# Patient Record
Sex: Male | Born: 1990 | Race: White | Hispanic: No | Marital: Single | Smoking: Never smoker
Health system: Southern US, Community
[De-identification: ages and names within clinical notes are randomized; demographics above are authoritative.]

## PROBLEM LIST (undated history)

## (undated) DIAGNOSIS — K625 Hemorrhage of anus and rectum: Secondary | ICD-10-CM

## (undated) HISTORY — PX: COLONOSCOPY: SHX174

## (undated) HISTORY — PX: OTHER SURGICAL HISTORY: SHX169

## (undated) HISTORY — PX: UPPER GASTROINTESTINAL ENDOSCOPY: SHX188

## (undated) HISTORY — PX: WISDOM TOOTH EXTRACTION: SHX21

## (undated) HISTORY — DX: Hemorrhage of anus and rectum: K62.5

---

## 2017-10-29 ENCOUNTER — Ambulatory Visit: Payer: Self-pay | Admitting: Adult Health

## 2017-10-29 ENCOUNTER — Encounter: Payer: Self-pay | Admitting: Adult Health

## 2017-10-29 VITALS — BP 130/70 | HR 82 | Temp 98.0°F | Resp 16 | Ht 70.0 in | Wt 188.0 lb

## 2017-10-29 DIAGNOSIS — R748 Abnormal levels of other serum enzymes: Secondary | ICD-10-CM

## 2017-10-29 DIAGNOSIS — J0100 Acute maxillary sinusitis, unspecified: Secondary | ICD-10-CM

## 2017-10-29 DIAGNOSIS — Z87898 Personal history of other specified conditions: Secondary | ICD-10-CM

## 2017-10-29 MED ORDER — AMOXICILLIN-POT CLAVULANATE 875-125 MG PO TABS: 1.0000 | ORAL_TABLET | Freq: Two times a day (BID) | ORAL | 0 refills | Status: DC

## 2017-10-29 NOTE — Progress Notes (Signed)
Subjective:     Patient ID: Frank Monroe, male   DOB: 1990/10/21, 27 y.o.   MRN: [id number]  HPI   Patient is a 27 year old male in no acute distress who presents to the clinic with sinus symptoms congestion x one month. He reports the nasal congestion worsened on this past Sunday.   He reports sore throat started this morning. Decreased energy/ fatigue since Monday this week he reports.  He has continued to go to the gym and work out. He is working today.   He has used Neti pot in past with boiled water but not in past few weeks.   He denies any history of previous sinus infections.    He has history of nose bleeds since child and now only has occasional now that stops easily- he reports he last Ear Nose Throat MD since he was a child.  He reports he blew his nose today and had mild blood in with nasal discharge- green to clear blood tinges mucous.   He denies seeing MD in " years" and no recent lab work.   He denies any other bleeding. Denies any syncope or near syncope. Denies headaches  There are no active problems to display for this patient. Denies any medications- or over the counter or prescription medication use.  Patient  denies any fever, chills, rash, chest pain, shortness of breath, nausea, vomiting, or diarrhea.   Review of Systems  Constitutional: Positive for chills (Monday felt very cold ) and fatigue. Negative for activity change, appetite change (" still hungry" ), diaphoresis, fever and unexpected weight change.       Still working / still working out   HENT: Positive for congestion, nosebleeds, postnasal drip, rhinorrhea and sinus pressure. Negative for ear discharge and ear pain.   Eyes: Negative.   Respiratory: Positive for cough. Negative for apnea, choking, chest tightness, shortness of breath, wheezing and stridor.   Cardiovascular: Negative for chest pain, palpitations and leg swelling.  Gastrointestinal: Negative.   Genitourinary: Negative.    Musculoskeletal: Negative.   Skin: Negative.   Neurological: Negative.   Hematological: Negative.   Psychiatric/Behavioral: Negative.        Objective:   Physical Exam  Constitutional: He is oriented to person, place, and time. He appears well-developed and well-nourished. No distress.  HENT:  Head: Normocephalic and atraumatic.  Right Ear: Hearing, external ear and ear canal normal. No tenderness. Tympanic membrane is erythematous (mild erythema ) and retracted. Tympanic membrane is not perforated. A middle ear effusion is present.  Left Ear: Hearing, external ear and ear canal normal. No tenderness. Tympanic membrane is retracted. Tympanic membrane is not perforated and not erythematous. A middle ear effusion is present.  Nose: Mucosal edema and rhinorrhea present. No nose lacerations or sinus tenderness. Epistaxis (none currently. ) is observed. Right sinus exhibits maxillary sinus tenderness. Right sinus exhibits no frontal sinus tenderness. Left sinus exhibits maxillary sinus tenderness. Left sinus exhibits no frontal sinus tenderness.  Mouth/Throat: Uvula is midline and mucous membranes are normal. Posterior oropharyngeal erythema present. No oropharyngeal exudate, posterior oropharyngeal edema or tonsillar abscesses.  Eyes: Conjunctivae, EOM and lids are normal. Pupils are equal, round, and reactive to light. Right eye exhibits no discharge. Left eye exhibits no discharge. No scleral icterus.  Neck: Normal range of motion and full passive range of motion without pain. Neck supple. No JVD present. No tracheal deviation present.  Cardiovascular: Normal rate, regular rhythm, normal heart sounds and intact distal pulses. Exam  reveals no gallop and no friction rub.  No murmur heard. Pulmonary/Chest: Effort normal and breath sounds normal. No stridor. No respiratory distress. He has no wheezes. He has no rales. He exhibits no tenderness.  Abdominal: Soft. Bowel sounds are normal.   Musculoskeletal: Normal range of motion.  Patient moves on and off of exam table and in room without difficulty. Gait is normal in hall and in room. Patient is oriented to person place time and situation. Patient answers questions appropriately and engages in conversation.   Lymphadenopathy:       Head (right side): No submental, no submandibular, no tonsillar, no preauricular, no posterior auricular and no occipital adenopathy present.       Head (left side): No submental, no submandibular, no tonsillar, no preauricular, no posterior auricular and no occipital adenopathy present.    He has no cervical adenopathy.    He has no axillary adenopathy.  Neurological: He is alert and oriented to person, place, and time. He has normal strength. He displays normal reflexes. No cranial nerve deficit. He exhibits normal muscle tone. Coordination normal.  Skin: Skin is warm, dry and intact. No bruising and no rash noted. He is not diaphoretic. No cyanosis or erythema. No pallor. Nails show no clubbing.  Psychiatric: He has a normal mood and affect. His speech is normal and behavior is normal. Judgment and thought content normal. Cognition and memory are normal.  Vitals reviewed.      Assessment:     Acute non-recurrent maxillary sinusitis - Plan: CBC w/Diff, Comprehensive metabolic panel, Comprehensive metabolic panel  History of epistaxis     Plan:     Meds ordered this encounter  Medications  . amoxicillin-clavulanate (AUGMENTIN) 875-125 MG tablet    Sig: Take 1 tablet by mouth 2 (two) times daily.    Dispense:  20 tablet    Refill:  0   Also start over the counter antihistamine of choice such as Allegra, Claritin or Zyrtec daily. Do not buy the decongestant brand. Generic is fine.   Will check CBC and CMP for baseline patient given fatigue and nosebleeds denies seeing provider in " years".   As courtesy patient also was given handout with contact number/ web site  for primary care  physicians within [facility]  health system and also [place] website to find a primary care doctor.  [facility] physician referral line number is [phone] and web site is [email or web address]. patient may choose any primary care physician within any organization as he prefers.  Primary care is recommended.  For each patient coming to this office as we are no longer accepting new primary care patients and this is an acute care clinic at this time for new patients.  Recommend yearly physicals and follow-up as recommended by primary care.  Patient is advised that if he continues to have nosebleeds after treatment for sinusitis and adding over the counter second generation antihistamine to regimen  and trying over-the-counter bought sterile  nasal saline rinse, he will need to return to the office for a follow-up and referral to ear nose and throat physician for further expert  evaluation given history. Discussed possible differential diagnosis. He will also run a humidifier at home. He will call for an appointment and seek emergency care after hours as needed.   Education hand out on Sinusitis, Nosebleeds, Nasal Saline, Provider does not recommend using Nettie pot or water from the tap as question with sterility.  Advised to return to clinic for  an appointment if no improvement within 72 hours or if any symptoms change or worsen. Advised ER or urgent Care if after hours or on weekend. 911 for emergency symptoms at any time.      Patient verbalized understanding of all instructions given and denies any further questions at this time.

## 2017-10-29 NOTE — Patient Instructions (Signed)
Amoxicillin; Clavulanic Acid tablets What is this medicine? AMOXICILLIN; CLAVULANIC ACID (a mox i SIL in; KLAV yoo lan ic AS id) is a penicillin antibiotic. It is used to treat certain kinds of bacterial infections. It will not work for colds, flu, or other viral infections. This medicine may be used for other purposes; ask your health care provider or pharmacist if you have questions. COMMON BRAND NAME(S): Augmentin What should I tell my health care provider before I take this medicine? They need to know if you have any of these conditions: -bowel disease, like colitis -kidney disease -liver disease -mononucleosis -an unusual or allergic reaction to amoxicillin, penicillin, cephalosporin, other antibiotics, clavulanic acid, other medicines, foods, dyes, or preservatives -pregnant or trying to get pregnant -breast-feeding How should I use this medicine? Take this medicine by mouth with a full glass of water. Follow the directions on the prescription label. Take at the start of a meal. Do not crush or chew. If the tablet has a score line, you may cut it in half at the score line for easier swallowing. Take your medicine at regular intervals. Do not take your medicine more often than directed. Take all of your medicine as directed even if you think you are better. Do not skip doses or stop your medicine early. Talk to your pediatrician regarding the use of this medicine in children. Special care may be needed. Overdosage: If you think you have taken too much of this medicine contact a poison control center or emergency room at once. NOTE: This medicine is only for you. Do not share this medicine with others. What if I miss a dose? If you miss a dose, take it as soon as you can. If it is almost time for your next dose, take only that dose. Do not take double or extra doses. What may interact with this medicine? -allopurinol -anticoagulants -birth control pills -methotrexate -probenecid This  list may not describe all possible interactions. Give your health care provider a list of all the medicines, herbs, non-prescription drugs, or dietary supplements you use. Also tell them if you smoke, drink alcohol, or use illegal drugs. Some items may interact with your medicine. What should I watch for while using this medicine? Tell your doctor or health care professional if your symptoms do not improve. Do not treat diarrhea with over the counter products. Contact your doctor if you have diarrhea that lasts more than 2 days or if it is severe and watery. If you have diabetes, you may get a false-positive result for sugar in your urine. Check with your doctor or health care professional. Birth control pills may not work properly while you are taking this medicine. Talk to your doctor about using an extra method of birth control. What side effects may I notice from receiving this medicine? Side effects that you should report to your doctor or health care professional as soon as possible: -allergic reactions like skin rash, itching or hives, swelling of the face, lips, or tongue -breathing problems -dark urine -fever or chills, sore throat -redness, blistering, peeling or loosening of the skin, including inside the mouth -seizures -trouble passing urine or change in the amount of urine -unusual bleeding, bruising -unusually weak or tired -white patches or sores in the mouth or throat Side effects that usually do not require medical attention (report to your doctor or health care professional if they continue or are bothersome): -diarrhea -dizziness -headache -nausea, vomiting -stomach upset -vaginal or anal irritation This list may   not describe all possible side effects. Call your doctor for medical advice about side effects. You may report side effects to FDA at 1-800-FDA-1088. Where should I keep my medicine? Keep out of the reach of children. Store at room temperature below 25 degrees  C (77 degrees F). Keep container tightly closed. Throw away any unused medicine after the expiration date. NOTE: This sheet is a summary. It may not cover all possible information. If you have questions about this medicine, talk to your doctor, pharmacist, or health care provider.  2018 Elsevier/Gold Standard (2007-12-17 12:04:30) Nosebleed, Adult A nosebleed is when blood comes out of the nose. Nosebleeds are common. Usually, they are not a sign of a serious condition. Nosebleeds can happen if a small blood vessel in your nose starts to bleed or if the lining of your nose (mucous membrane) cracks. They are commonly caused by:  Allergies.  Colds.  Picking your nose.  Blowing your nose too hard.  An injury from sticking an object into your nose or getting hit in the nose.  Dry or cold air.  Less common causes of nosebleeds include:  Toxic fumes.  Something abnormal in the nose or in the air-filled spaces in the bones of the face (sinuses).  Growths in the nose, such as polyps.  Medicines or conditions that cause blood to clot slowly.  Certain illnesses or procedures that irritate or dry out the nasal passages.  Follow these instructions at home: When you have a nosebleed:  Sit down and tilt your head slightly forward.  Use a clean towel or tissue to pinch your nostrils under the bony part of your nose. After 10 minutes, let go of your nose and see if bleeding starts again. Do not release pressure before that time. If there is still bleeding, repeat the pinching and holding for 10 minutes until the bleeding stops.  Do not place tissues or gauze in the nose to stop bleeding.  Avoid lying down and avoid tilting your head backward. That may make blood collect in the throat and cause gagging or coughing.  Use a nasal spray decongestant to help with a nosebleed as told by your health care provider.  Do not use petroleum jelly or mineral oil in your nose. It can drip into your  lungs. After a nosebleed:  Avoid blowing your nose or sniffing for a number of hours.  Avoid straining, lifting, or bending at the waist for several days. You may resume other normal activities as you are able.  Use saline spray or a humidifier as told by your health care provider.  Aspirinand blood thinners make bleeding more likely. If you are prescribed these medicines and you suffer from nosebleeds: ? Ask your health care provider if you should stop taking the medicines or if you should adjust the dose. ? Do not stop taking medicines that your health care provider has recommended unless told by your health care provider.  If your nosebleed was caused by dry mucous membranes, use over-the-counter saline nasal spray or gel. This will keep the mucous membranes moist and allow them to heal. If you must use a lubricant: ? Choose one that is water-soluble. ? Use only as much as you need and use it only as often as needed. ? Do not lie down until several hours after you use it. Contact a health care provider if:  You have a fever.  You get nosebleeds often or more often than usual.  You bruise very easily.  You have a nosebleed from having something stuck in your nose.  You have bleeding in your mouth.  You vomit or cough up brown material.  You have a nosebleed after you start a new medicine. Get help right away if:  You have a nosebleed after a fall or a head injury.  Your nosebleed does not go away after 20 minutes.  You feel dizzy or weak.  You have unusual bleeding from other parts of your body.  You have unusual bruising on other parts of your body.  You become sweaty.  You vomit blood. This information is not intended to replace advice given to you by your health care provider. Make sure you discuss any questions you have with your health care provider. Document Released: 07/03/2005 Document Revised: 05/23/2016 Document Reviewed: 04/09/2016 Elsevier Interactive  Patient Education  2018 [employer] Sinusitis, Adult Sinusitis is soreness and inflammation of your sinuses. Sinuses are hollow spaces in the bones around your face. They are located:  Around your eyes.  In the middle of your forehead.  Behind your nose.  In your cheekbones.  Your sinuses and nasal passages are lined with a stringy fluid (mucus). Mucus normally drains out of your sinuses. When your nasal tissues get inflamed or swollen, the mucus can get trapped or blocked so air cannot flow through your sinuses. This lets bacteria, viruses, and funguses grow, and that leads to infection. Follow these instructions at home: Medicines  Take, use, or apply over-the-counter and prescription medicines only as told by your doctor. These may include nasal sprays.  If you were prescribed an antibiotic medicine, take it as told by your doctor. Do not stop taking the antibiotic even if you start to feel better. Hydrate and Humidify  Drink enough water to keep your pee (urine) clear or pale yellow.  Use a cool mist humidifier to keep the humidity level in your home above 50%.  Breathe in steam for 10-15 minutes, 3-4 times a day or as told by your doctor. You can do this in the bathroom while a hot shower is running.  Try not to spend time in cool or dry air. Rest  Rest as much as possible.  Sleep with your head raised (elevated).  Make sure to get enough sleep each night. General instructions  Put a warm, moist washcloth on your face 3-4 times a day or as told by your doctor. This will help with discomfort.  Wash your hands often with soap and water. If there is no soap and water, use hand sanitizer.  Do not smoke. Avoid being around people who are smoking (secondhand smoke).  Keep all follow-up visits as told by your doctor. This is important. Contact a doctor if:  You have a fever.  Your symptoms get worse.  Your symptoms do not get better within 10 days. Get help right  away if:  You have a very bad headache.  You cannot stop throwing up (vomiting).  You have pain or swelling around your face or eyes.  You have trouble seeing.  You feel confused.  Your neck is stiff.  You have trouble breathing. This information is not intended to replace advice given to you by your health care provider. Make sure you discuss any questions you have with your health care provider. Document Released: 03/11/2008 Document Revised: 05/19/2016 Document Reviewed: 07/19/2015 Elsevier Interactive Patient Education  2018 Elsevier Inc. Sinus Rinse What is a sinus rinse? A sinus rinse is a home treatment. It rinses  your sinuses with a mixture of salt and water (saline solution). Sinuses are air-filled spaces in your skull behind the bones of your face and forehead. They open into your nasal cavity. To do a sinus rinse, you will need:  Saline solution.  Neti pot or spray bottle. This releases the saline solution into your nose and through your sinuses. You can buy neti pots and spray bottles at: ? [employer]. ? A health food store. ? Online.  When should I do a sinus rinse? A sinus rinse can help to clear your nasal cavity. It can clear:  Mucus.  Dirt.  Dust.  Pollen.  You may do a sinus rinse when you have:  A cold.  A virus.  Allergies.  A sinus infection.  A stuffy nose.  If you are considering a sinus rinse:  Ask your child's doctor before doing a sinus rinse on your child.  Do not do a sinus rinse if you have had: ? Ear or nasal surgery. ? An ear infection. ? Blocked ears.  How do I do a sinus rinse?  Wash your hands.  Disinfect your device using the directions that came with the device.  Dry your device.  Use the solution that comes with your device or one that is sold separately in stores. Follow the mixing directions on the package.  Fill your device with the amount of saline solution as stated in the device  instructions.  Stand over a sink and tilt your head sideways over the sink.  Place the spout of the device in your upper nostril (the one closer to the ceiling).  Gently pour or squeeze the saline solution into the nasal cavity. The liquid should drain to the lower nostril if you are not too congested.  Gently blow your nose. Blowing too hard may cause ear pain.  Repeat in the other nostril.  Clean and rinse your device with clean water.  Air-dry your device. Are there risks of a sinus rinse? Sinus rinse is normally very safe and helpful. However, there are a few risks, which include:  A burning feeling in the sinuses. This may happen if you do not make the saline solution as instructed. Make sure to follow all directions when making the saline solution.  Infection from unclean water. This is rare, but possible.  Nasal irritation.  This information is not intended to replace advice given to you by your health care provider. Make sure you discuss any questions you have with your health care provider. Document Released: 04/20/2014 Document Revised: 08/20/2016 Document Reviewed: 02/08/2014 Elsevier Interactive Patient Education  2017 [employer].

## 2017-10-30 LAB — COMPREHENSIVE METABOLIC PANEL
ALBUMIN: 4.7 g/dL (ref 3.5–5.5)
ALK PHOS: 97 IU/L (ref 39–117)
ALT: 58 IU/L — AB (ref 0–44)
AST: 45 IU/L — AB (ref 0–40)
Albumin/Globulin Ratio: 2.2 (ref 1.2–2.2)
BILIRUBIN TOTAL: 0.4 mg/dL (ref 0.0–1.2)
BUN / CREAT RATIO: 15 (ref 9–20)
BUN: 15 mg/dL (ref 6–20)
CHLORIDE: 100 mmol/L (ref 96–106)
CO2: 26 mmol/L (ref 20–29)
Calcium: 10.1 mg/dL (ref 8.7–10.2)
Creatinine, Ser: 1 mg/dL (ref 0.76–1.27)
GFR calc Af Amer: 120 mL/min/{1.73_m2} (ref >59)
GFR calc non Af Amer: 103 mL/min/{1.73_m2} (ref >59)
GLUCOSE: 109 mg/dL — AB (ref 65–99)
Globulin, Total: 2.1 g/dL (ref 1.5–4.5)
Potassium: 4.4 mmol/L (ref 3.5–5.2)
Sodium: 141 mmol/L (ref 134–144)
Total Protein: 6.8 g/dL (ref 6.0–8.5)

## 2017-10-30 LAB — CBC WITH DIFFERENTIAL/PLATELET
BASOS ABS: 0 10*3/uL (ref 0.0–0.2)
Basos: 1 %
EOS (ABSOLUTE): 0.3 10*3/uL (ref 0.0–0.4)
Eos: 4 %
Hematocrit: 43.9 % (ref 37.5–51.0)
Hemoglobin: 15 g/dL (ref 13.0–17.7)
Immature Grans (Abs): 0 10*3/uL (ref 0.0–0.1)
Immature Granulocytes: 0 %
LYMPHS ABS: 2 10*3/uL (ref 0.7–3.1)
Lymphs: 31 %
MCH: 31 pg (ref 26.6–33.0)
MCHC: 34.2 g/dL (ref 31.5–35.7)
MCV: 91 fL (ref 79–97)
Monocytes Absolute: 0.9 10*3/uL (ref 0.1–0.9)
Monocytes: 15 %
NEUTROS ABS: 3.2 10*3/uL (ref 1.4–7.0)
Neutrophils: 49 %
PLATELETS: 261 10*3/uL (ref 150–379)
RBC: 4.84 x10E6/uL (ref 4.14–5.80)
RDW: 13.8 % (ref 12.3–15.4)
WBC: 6.3 10*3/uL (ref 3.4–10.8)

## 2017-10-30 NOTE — Addendum Note (Signed)
Addended by: Berniece PapFLINCHUM, Revere Maahs S on: 10/30/2017 11:47 AM   Modules accepted: Orders

## 2017-10-30 NOTE — Progress Notes (Signed)
Patient ID: Frank Monroe, male   DOB: 30-Mar-1991, 27 y.o.   MRN: [id number]   Armando Gangracy Greene RN called [facility] to add on TSH- order in computer as well.   I have already ordered this as a future order in the computer for lipid profile.   Orders Placed This Encounter  Procedures  .  .  .  . TSH  . Lipid Profile    Standing Status:   Future    Standing Expiration Date:   01/28/2018  TSH is add on order for now.  [facility] was notified

## 2017-10-30 NOTE — Progress Notes (Addendum)
Armando Gangracy Greene RN - 10/30/17 10:27am -  Please call patient and make phone call - let him know that he is not anemic, kidney function is normal, and  his liver enzymes are elevated mildly. Question if he has been using any alcohol or tylenol containing products. He should avoid all alcohol and tylenol containing products. Maintain low fat diet avoiding processed foods.  I am also adding on blood work for his thyroid and we will call if that is abnormal. He should have his liver panel checked again in 6 weeks and schedule an appointment for follow up the following week after labs. Keep current plan as discussed in office on 10/30/16 as well.  Provider thoroughly discussed in collaboration above plan with supervising physician Dr. Julieanne Mansonichard Gilbert who is in agreement with the care plan as above.

## 2017-10-31 ENCOUNTER — Telehealth: Payer: Self-pay

## 2017-10-31 NOTE — Telephone Encounter (Signed)
Contacted patient to inform him of lab results. (See Frank DusterMichelle Flinchum's note). Pt states he drinks occasionally approx 7 beers a week and takes Tylenol sporadically for fever.  Instructed patient to avoid alcohol and tylenol. He is to follow up in 6 weeks for repeat liver panel. Appointment already scheduled with us for 6 weeks. Patient verbalizes understanding of instructions and will contact us with any further questions.

## 2017-11-18 ENCOUNTER — Ambulatory Visit: Payer: Self-pay | Admitting: Dietician

## 2017-11-18 NOTE — Progress Notes (Signed)
Nutrition:  11/18/2017  [id number]:Seeking nutritional information for establishing a healthy way of eating to maintain normal blood enzymes.  Assessment: Recently diagnosed with elevated liver enzymes and advised to omit alcohol, sweets, and lose weight. Has omitted alcohol, sweets, desserts, soda, high sugar foods, and pizza. Wants to know how many calories to aim at for a day.   Currently working out 3-4 times per week. HT: 5 ft 10 in  WT: was 180-182 lbs. Current weight at 172.2 lbs. Eating at home more, cooking more.  Switched to [facility].  Stopped snacking or having only nuts or a granola bar or fruit. Breakfast: over-night oats with almond milk and blue berries and banana or a 100 calorie granola bar. Lunch.  Aims for 900 calories or less.  Eats in cafeteria and monitors calories closely. Dinner: Maybe a cooked meat and a starch with fruit as dessert.  Recommendations: Continue to monitor and limit alcohol and sweet beverages and desserts. If using sweetened tea, use a 1/2 and 1/2 mixture of sweet and unsweetened. Eat across the spectrum from all groups. Aim for 1800-2000 calories per day. Healthy weight for you is 158- 175 lbs. Monitor portions.  Don't have to eat the whole thing especially a dessert.  Personal Goals: Be healthy Monitor live enzymes with MS and make healthy food choices. Lose a little weight to get to 170 lbs.  F/U: As needed. Call belinda for F/U Can call me for getting a weight on [place] scales.  Teaching Materials: Food Group Handout Food Label  Frank Shadee Montoya, RN, RD, [employer]

## 2017-11-21 LAB — SPECIMEN STATUS REPORT

## 2017-11-21 LAB — TSH: TSH: 2.89 u[IU]/mL (ref 0.450–4.500)

## 2017-12-10 ENCOUNTER — Other Ambulatory Visit: Payer: Self-pay

## 2017-12-10 DIAGNOSIS — R748 Abnormal levels of other serum enzymes: Secondary | ICD-10-CM

## 2017-12-11 ENCOUNTER — Telehealth: Payer: Self-pay

## 2017-12-11 LAB — LIPID PANEL
CHOLESTEROL TOTAL: 148 mg/dL (ref 100–199)
Chol/HDL Ratio: 3.1 ratio (ref 0.0–5.0)
HDL: 48 mg/dL (ref >39)
LDL CALC: 91 mg/dL (ref 0–99)
Triglycerides: 43 mg/dL (ref 0–149)
VLDL CHOLESTEROL CAL: 9 mg/dL (ref 5–40)

## 2017-12-11 NOTE — Progress Notes (Signed)
Normal lipids - added on CMP with [facility]- Armando Gangracy greene calling.

## 2017-12-11 NOTE — Telephone Encounter (Signed)
Contacted [facility] to add CMP to previous lab draw.

## 2017-12-11 NOTE — Progress Notes (Signed)
Orders Placed This Encounter  Procedures  . Comprehensive metabolic panel    Add on order for [facility] - Armando Gangracy Greene RN calling [facility] and adding on.   Patient not seen lab appointment only.

## 2017-12-12 LAB — COMP. METABOLIC PANEL (12)
A/G RATIO: 2.4 — AB (ref 1.2–2.2)
AST: 29 IU/L (ref 0–40)
Albumin: 4.7 g/dL (ref 3.5–5.5)
Alkaline Phosphatase: 90 IU/L (ref 39–117)
BUN/Creatinine Ratio: 14 (ref 9–20)
BUN: 14 mg/dL (ref 6–20)
Bilirubin Total: 0.8 mg/dL (ref 0.0–1.2)
CALCIUM: 10.1 mg/dL (ref 8.7–10.2)
CREATININE: 1.01 mg/dL (ref 0.76–1.27)
Chloride: 105 mmol/L (ref 96–106)
GFR calc non Af Amer: 101 mL/min/{1.73_m2} (ref >59)
GFR, EST AFRICAN AMERICAN: 117 mL/min/{1.73_m2} (ref >59)
GLUCOSE: 72 mg/dL (ref 65–99)
Globulin, Total: 2 g/dL (ref 1.5–4.5)
Potassium: 4.2 mmol/L (ref 3.5–5.2)
Sodium: 143 mmol/L (ref 134–144)
Total Protein: 6.7 g/dL (ref 6.0–8.5)

## 2017-12-12 LAB — SPECIMEN STATUS REPORT

## 2018-01-29 ENCOUNTER — Ambulatory Visit: Payer: Self-pay | Admitting: Adult Health

## 2018-01-29 ENCOUNTER — Encounter: Payer: Self-pay | Admitting: Adult Health

## 2018-01-29 VITALS — BP 132/70 | HR 97 | Temp 98.5°F | Resp 16 | Ht 70.0 in | Wt 159.0 lb

## 2018-01-29 DIAGNOSIS — Z87898 Personal history of other specified conditions: Secondary | ICD-10-CM

## 2018-01-29 DIAGNOSIS — K921 Melena: Secondary | ICD-10-CM

## 2018-01-29 DIAGNOSIS — R195 Other fecal abnormalities: Secondary | ICD-10-CM

## 2018-01-29 NOTE — Progress Notes (Signed)
Subjective:     Patient ID: Frank Monroe, male   DOB: Dec 05, 1990, 27 y.o.   MRN: [id number]  Patient is 27 year old male  He reports he was diagnosis with " inflamed intestines "  4 years ago at that time  He saw [facility] when he lived there and reports having a colonoscopy and endoscopy at that time. He is eating better now - cut out soda / sugar and refined carbohydrates and increased fiber and has lost 30 -35 lbs in three months intentionally per his report. He reports mild rectal pain with defecation. He reports noticing tinges of blood mixed in stool since the beginning of April. Denies bloody toilet water or tarry colored stools.  Denies any  rectal intercourse.  Reports good appetite. Denies abdominal pain or tenderness.   He denies any fever or pain.   Patient  denies any fever, body aches,chills, rash, chest pain, shortness of breath, nausea, vomiting, or diarrhea.    Filed Weights   01/29/18 1336  Weight: 159 lb (72.1 kg)    Rectal Bleeding   Episode onset: noticed blood intermittently since the first week of april - he thought he had eaten  red peppers  The onset was sudden. The problem occurs frequently (every two days or so ). The problem has been unchanged. The patient is experiencing no pain. The stool is described as soft and streaked with blood (" speckles" dots of blood in stool /none in toilet water ). Treatments tried: he reports he was told by [facility] that he has inflamed intestines and  was treated with a medication that helped- he does not know the medication  Associated symptoms include rectal pain (mild after  BM ). Pertinent negatives include no anorexia, no fever, no abdominal pain, no diarrhea, no hematemesis, no hemorrhoids, no nausea, no vomiting, no hematuria, no vaginal bleeding, no vaginal discharge, no chest pain, no headaches, no coughing, no difficulty breathing and no rash.      Review of Systems  Constitutional: Negative for  fever.  Respiratory: Negative for cough.   Cardiovascular: Negative for chest pain.  Gastrointestinal: Positive for hematochezia and rectal pain (mild after  BM ). Negative for abdominal pain, anorexia, diarrhea, hematemesis, hemorrhoids, nausea and vomiting.  Genitourinary: Negative for hematuria, vaginal bleeding and vaginal discharge.  Skin: Negative for rash.  Neurological: Negative for headaches.       Objective:   Physical Exam  Constitutional: He is oriented to person, place, and time. Vital signs are normal. He appears well-developed and well-nourished.  Non-toxic appearance. He does not have a sickly appearance. He does not appear ill. No distress.  HENT:  Head: Normocephalic and atraumatic.  Nose: Nose normal.  Mouth/Throat: Uvula is midline and mucous membranes are normal. No oropharyngeal exudate, posterior oropharyngeal edema, posterior oropharyngeal erythema or tonsillar abscesses.  Patient is alert and oriented and responsive to questions Engages in eye contact with provider. Speaks in full sentences without any pauses without any shortness of breath.    Eyes: Pupils are equal, round, and reactive to light. Conjunctivae, EOM and lids are normal.  Neck: Trachea normal, normal range of motion and phonation normal. Neck supple. Normal carotid pulses, no hepatojugular reflux and no JVD present. No tracheal tenderness present. Carotid bruit is not present. No tracheal deviation present. No thyromegaly present.  Cardiovascular: Normal rate, regular rhythm, normal heart sounds and intact distal pulses. Exam reveals no gallop and no friction rub.  No murmur heard. Pulmonary/Chest: Effort  normal and breath sounds normal. No stridor. No respiratory distress. He has no wheezes. He has no rales. He exhibits no tenderness.  Abdominal: Soft. Normal aorta and bowel sounds are normal. He exhibits no shifting dullness, no distension, no pulsatile liver, no fluid wave, no abdominal bruit, no  ascites, no pulsatile midline mass and no mass. There is no hepatosplenomegaly, splenomegaly or hepatomegaly. There is no tenderness. There is no rigidity, no rebound, no guarding, no CVA tenderness, no tenderness at McBurney's point and negative Murphy's sign.  Genitourinary:  Genitourinary Comments: Provider recommended chaperoned Rectal exam- patient declined exam.  He was willing to leave stool on Hemoccult card in restroom and Hemoccult is positive.   Musculoskeletal: Normal range of motion.  Patient moves on and off of exam table and in room without difficulty. Gait is normal in hall and in room. Patient is oriented to person place time and situation. Patient answers questions appropriately and engages in conversation.   Lymphadenopathy:    He has no cervical adenopathy.  Neurological: He is alert and oriented to person, place, and time. He has normal reflexes.  Skin: Skin is warm and dry. No rash noted. He is not diaphoretic. No erythema. No pallor.  Psychiatric: He has a normal mood and affect. His behavior is normal. Judgment and thought content normal.  Vitals reviewed.      Assessment:       Blood in the stool  Heme positive stool   Plan:     Will increase Fiber foods per AVS handout. Given history will refer back to local GI MD for evaluation. Patient declined recommended rectal exam. Advised he may schedule another day for this if he prefers. He reports he prefers to see GI for this. If increased blood or pain develops he will go to the emergency room.   Discussed signs and symptoms of emergent symptoms and when to seek medical treatment.  Advised patient call the office or your primary care doctor for an appointment if no improvement within 72 hours or if any symptoms change or worsen at any time  Advised ER or urgent Care if after hours or on weekend. Call 911 for emergency symptoms at any time.Patinet verbalized understanding of all instructions given/reviewed and  treatment plan and has no further questions or concerns at this time.    Patient verbalized understanding of all instructions given and denies any further questions at this time.

## 2018-01-29 NOTE — Patient Instructions (Signed)
Lower Gastrointestinal Bleeding Lower gastrointestinal (GI) bleeding is the result of bleeding from the colon, rectum, or anal area. The colon is the last part of the digestive tract, where stool, also called feces, is formed. If you have lower GI bleeding, you may see blood in or on your stool. It may be bright red. Lower GI bleeding often stops without treatment. Continued or heavy bleeding needs emergency treatment at the hospital. What are the causes? Lower GI bleeding may be caused by:  A condition that causes pouches to form in the colon over time (diverticulosis).  Swelling and irritation (inflammation) in areas with diverticulosis (diverticulitis).  Inflammation of the colon (inflammatory bowel disease).  Swollen veins in the rectum (hemorrhoids).  Painful tears in the anus (anal fissures), often caused by passing hard stools.  Cancer of the colon or rectum.  Noncancerous growths (polyps) of the colon or rectum.  A bleeding disorder that impairs the formation of blood clots and causes easy bleeding (coagulopathy).  An abnormal weakening of a blood vessel where an artery and a vein come together (arteriovenous malformation).  What increases the risk? You are more likely to develop this condition if:  You are older than 27 years of age.  You take aspirin or NSAIDs on a regular basis.  You take anticoagulant or antiplatelet drugs.  You have a history of high-dose X-ray treatment (radiation therapy) of the colon.  You recently had a colon polyp removed.  What are the signs or symptoms? Symptoms of this condition include:  Bright red blood or blood clots coming from your rectum.  Bloody stools.  Black or maroon-colored stools.  Pain or cramping in the abdomen.  Weakness or dizziness.  Racing heartbeat.  How is this diagnosed? This condition may be diagnosed based on:  Your symptoms and medical history.  A physical exam. During the exam, your health care  provider will check for signs of blood loss, such as low blood pressure and a rapid pulse.  Tests, such as: ? Flexible sigmoidoscopy. In this procedure, a flexible tube with a camera on the end is used to examine your anus and the first part of your colon to look for the source of bleeding. ? Colonoscopy. This is similar to a flexible sigmoidoscopy, but the camera can extend all the way to the uppermost part of your colon. ? Blood tests to measure your red blood cell count and to check for coagulopathy. ? An imaging study of your colon to look for a bleeding site. In some cases, you may have X-rays taken after a dye or radioactive substance is injected into your bloodstream (angiogram).  How is this treated? Treatment for this condition depends on the cause of the bleeding. Heavy or persistent bleeding is treated at the hospital. Treatment may include:  Getting fluids through an IV tube inserted into one of your veins.  Getting blood through an IV tube (blood transfusion).  Stopping bleeding through high-heat coagulation, injections of certain medicines, or applying surgical clips. This can all be done during a colonoscopy.  Having a procedure that involves first doing an angiogram and then blocking blood flow to the bleeding site (embolization).  Stopping some of your regular medicines for a certain amount of time.  Having surgery to remove part of the colon. This may be needed if bleeding is severe and does not respond to other treatment.  Follow these instructions at home:  Take over-the-counter and prescription medicines only as told by your health care provider.   You may need to avoid aspirin, NSAIDs, or other medicines that increase bleeding.  Eat foods that are high in fiber. This will help keep your stools soft. These foods include whole grains, legumes, fruits, and vegetables. Eating 1-3 prunes each day works well for many people.  Drink enough fluid to keep your urine clear or  pale yellow.  Keep all follow-up visits as told by your health care provider. This is important. Contact a health care provider if:  Your symptoms do not improve. Get help right away if:  Your bleeding increases.  You feel light-headed or you faint.  You feel weak.  You have severe cramps in your back or abdomen.  You pass large blood clots in your stool.  Your symptoms get worse. This information is not intended to replace advice given to you by your health care provider. Make sure you discuss any questions you have with your health care provider. Document Released: 02/08/2016 Document Revised: 02/29/2016 Document Reviewed: 02/08/2016 Elsevier Interactive Patient Education  2018 [employer] Stool for Occult Blood Test Why am I having this test? Stool for occult blood, or fecal occult blood test (FOBT), is a test that is used to screen for gastrointestinal (GI) bleeding, which may be an indicator of colon cancer. This test can also detect small amounts of blood in your stool (feces) from other causes, such as ulcers or hemorrhoids. This test is usually done as part of an annual routine examination after age 27. What kind of sample is taken? A sample of your stool is required for this test. Your health care provider may collect the sample with a swab of the rectum. Or, you may be instructed to collect the sample in a container at home. If you are instructed to collect the sample, your health care provider will provide you with the instructions and the supplies that you will need to do that. How do I collect samples at home? A stool sample may need to be collected at home. When collecting a sample at home, make sure that you:  Use the sterile containers and other supplies that were given to you from the lab.  Do not mix urine, toilet paper, or water with your sample.  Label all slides and containers with your name and the date when you collected the sample.  Your health care  provider or lab staff will give you one or more test "cards." You will collect a separate sample from three different stools, usually on different days that follow each other. Follow these steps for each sample: 1. Collect a stool sample into a clean container. 2. With an applicator stick, apply a thin smear of stool sample onto each filter paper square or window that is on the card. 3. Allow the filter paper to dry. After it is dry, the sample will be stable.  Usually, you will return all of the samples to your health care provider or lab at the same time. How do I prepare for this test?  Do not eat any red meat within three days before testing.  Follow your health care provider's instructions about eating and drinking prior to the test. Your health care provider may instruct you to avoid other foods or substances.  Ask your health care provider about taking or not taking your medicines prior to the test. You may be instructed to avoid certain medicines that are known to interfere with this test. How are the results reported? Your test results will be reported as  either positive or negative. It is your responsibility to obtain your test results. Ask the lab or department performing the test when and how you will get your results. What do the results mean? A negative test result means that there is no occult blood within the stool. A negative result is normal. A positive test result may mean that there is blood in the stool. Causes of blood in the stool include:  GI tumors.  Certain GI diseases.  GI trauma or recent surgery.  Hemorrhoids.  Talk with your health care provider to discuss your results, treatment options, and if necessary, the need for more tests. Talk with your health care provider if you have any questions about your results. Talk with your health care provider to discuss your results, treatment options, and if necessary, the need for more tests. Talk with your health care  provider if you have any questions about your results. This information is not intended to replace advice given to you by your health care provider. Make sure you discuss any questions you have with your health care provider. Document Released: 10/18/2004 Document Revised: 05/22/2016 Document Reviewed: 02/18/2014 Elsevier Interactive Patient Education  [employer].

## 2018-02-02 ENCOUNTER — Encounter: Payer: Self-pay | Admitting: Gastroenterology

## 2018-02-02 ENCOUNTER — Ambulatory Visit (INDEPENDENT_AMBULATORY_CARE_PROVIDER_SITE_OTHER): Payer: BLUE CROSS/BLUE SHIELD | Admitting: Gastroenterology

## 2018-02-02 VITALS — BP 118/70 | HR 66 | Ht 70.0 in | Wt 160.2 lb

## 2018-02-02 DIAGNOSIS — K625 Hemorrhage of anus and rectum: Secondary | ICD-10-CM | POA: Diagnosis not present

## 2018-02-02 DIAGNOSIS — R194 Change in bowel habit: Secondary | ICD-10-CM

## 2018-02-02 MED ORDER — PEG-KCL-NACL-NASULF-NA ASC-C 140 G PO SOLR: 140.0000 g | ORAL | 0 refills | Status: DC

## 2018-02-02 NOTE — Patient Instructions (Signed)
If you are age 27 or older, your body mass index should be between 23-30. Your Body mass index is 22.99 kg/m. If this is out of the aforementioned range listed, please consider follow up with your Primary Care Provider.  If you are age 42 or younger, your body mass index should be between 19-25. Your Body mass index is 22.99 kg/m. If this is out of the aformentioned range listed, please consider follow up with your Primary Care Provider.   You have been scheduled for a colonoscopy. Please follow written instructions given to you at your visit today.  Please pick up your prep supplies at the pharmacy within the next 1-3 days. If you use inhalers (even only as needed), please bring them with you on the day of your procedure. Your physician has requested that you go to www.startemmi.com and enter the access code given to you at your visit today. This web site gives a general overview about your procedure. However, you should still follow specific instructions given to you by our office regarding your preparation for the procedure.  Thank you for choosing [facility]  Dr Amada Jupiter III

## 2018-02-02 NOTE — Progress Notes (Addendum)
[facility] Consult Note:  History: Frank Monroe 02/02/2018  Referring physician: Kathalene Frames, FNP  Reason for consult/chief complaint: Rectal Bleeding (blood in stools, noticed it starting a few weeks ago , Patient has had an Endo and colonoscopy in [place] cant remeber the results )   Subjective  HPI:  This is a very pleasant 27 year old man referred by the health clinic where he works at [employer].  For about the last month he has noticed some intermittent blood streaking in the stool and  no change in bowel habits, where the stool is smaller and less formed than before.  He does not have loose or watery stool.  He denies abdominal pain unless he feels constipated.  He was concerned because the symptoms seem reminiscent of the beginning of a problem he had several years ago while living in [place].  Those records are not available but he signed a release form and we will request them.  Frank Monroe reports that at that time he had similar symptoms to this but then escalated to worsened abdominal pain and bleeding, leading to an EGD and colonoscopy.  He really cannot recall the findings other than perhaps there was "some inflammation" in the intestine and he took about 2 weeks of medicines.  Symptoms improved, and he did not feel he needed medicines thereafter.  His weight has been variable over the years that he largely attributes to diet, in the last several months purposely lost 20 pounds by cutting out empty calories in the way of alcohol and sweetened drinks.  He denies chronic nausea, vomiting, dysphagia, early satiety.  ROS:  Review of Systems  Constitutional: Negative for appetite change and unexpected weight change.  HENT: Negative for mouth sores and voice change.   Eyes: Negative for pain and redness.  Respiratory: Negative for cough and shortness of breath.   Cardiovascular: Negative for chest pain and palpitations.  Genitourinary: Negative for dysuria  and hematuria.  Musculoskeletal: Negative for arthralgias and myalgias.  Skin: Negative for pallor and rash.  Neurological: Negative for weakness and headaches.  Hematological: Negative for adenopathy.     Past Medical History: History reviewed. No pertinent past medical history. He has no chronic medical problems.  He has the GI history in [place] that we have not yet obtained  Past Surgical History: History reviewed. No pertinent surgical history. No prior surgery  Family History: Family History  Problem Relation Age of Onset  . Ulcerative colitis Mother    Upon further questioning, he reports that his mother has had long-standing problems from "ulcers", but it is not entirely clear what this condition is.  Although his reported family history includes ulcerative colitis, it is not clear if that is accurate.  Social History: Social History   Socioeconomic History  . Marital status: Single    Spouse name: Not on file  . Number of children: Not on file  . Years of education: Not on file  . Highest education level: Master's degree (e.g., MA, MS, MEng, MEd, MSW, MBA)  Occupational History  . Not on file  Social Needs  . Financial resource strain: Not on file  . Food insecurity:    Worry: Not on file    Inability: Not on file  . Transportation needs:    Medical: Not on file    Non-medical: Not on file  Tobacco Use  . Smoking status: Never Smoker  . Smokeless tobacco: Never Used  Substance and Sexual Activity  . Alcohol use: Yes  Comment: 5 drinks weekly  . Drug use: No  . Sexual activity: Not on file  Lifestyle  . Physical activity:    Days per week: Not on file    Minutes per session: Not on file  . Stress: Not on file  Relationships  . Social connections:    Talks on phone: Not on file    Gets together: Not on file    Attends religious service: Not on file    Active member of club or organization: Not on file    Attends meetings of clubs or organizations:  Not on file    Relationship status: Not on file  Other Topics Concern  . Not on file  Social History Narrative  . Not on file   He works as a Higher education careers adviser life at [employer]  Allergies: No Known Allergies  Outpatient Meds: Current Outpatient Medications  Medication Sig Dispense Refill  . PEG-KCl-NaCl-NaSulf-Na Asc-C (PLENVU) 140 g SOLR Take 140 g by mouth as directed. 1 each 0   No current facility-administered medications for this visit.       ___________________________________________________________________ Objective   Exam:  BP 118/70   Pulse 66   Ht  (1.778 m)   Wt 160 lb 3.2 oz (72.7 kg)   SpO2 99%   BMI 22.99 kg/m    General: this is a(n) well-appearing man, no muscle wasting  Eyes: sclera anicteric, no redness  ENT: oral mucosa moist without lesions, no cervical or supraclavicular lymphadenopathy, good dentition  CV: RRR without murmur, S1/S2, no JVD, no peripheral edema  Resp: clear to auscultation bilaterally, normal RR and effort noted  GI: soft, no tenderness, with active bowel sounds. No guarding or palpable organomegaly noted.  Skin; warm and dry, no rash or jaundice noted  Neuro: awake, alert and oriented x 3. Normal gross motor function and fluent speech  Labs:  CMP Latest Ref Rng & Units 12/10/2017 10/29/2017  Glucose 65 - 99 mg/dL 72 295(A)  BUN 6 - 20 mg/dL 14 15  Creatinine 2.13 - 1.27 mg/dL 0.86 5.78  Sodium 469 - 144 mmol/L 143 141  Potassium 3.5 - 5.2 mmol/L 4.2 4.4  Chloride 96 - 106 mmol/L 105 100  CO2 20 - 29 mmol/L - 26  Calcium 8.7 - 10.2 mg/dL 62.9 52.8  Total Protein 6.0 - 8.5 g/dL 6.7 6.8  Total Bilirubin 0.0 - 1.2 mg/dL 0.8 0.4  Alkaline Phos 39 - 117 IU/L 90 97  AST 0 - 40 IU/L 29 45(H)  ALT 0 - 44 IU/L - 58(H)   Has cut back EtOH last few months when the LFTs were elevated  CBC Latest Ref Rng & Units 10/29/2017  WBC 3.4 - 10.8 x10E3/uL 6.3  Hemoglobin 13.0 - 17.7 g/dL 41.3  Hematocrit 24.4 - 51.0 % 43.9    Platelets 150 - 379 x10E3/uL 261   TSH normal 1/23   Assessment: Encounter Diagnoses  Name Primary?  . Rectal bleeding Yes  . Change in bowel habits     Symptoms are somewhat difficult to characterize.  It seems unlikely to be IBD without diarrhea, but he said previously the symptoms escalated to include bloody diarrhea, so perhaps this is the beginning of a flare that same problem.  It is difficult to know for certain without the previous records.  While we have requested the records, I feel he needs a colonoscopy at this point to work-up the symptoms.  An upper endoscopy does not seem necessary. Perhaps he has  had a change in bowel habits related to his purposeful diet changes and weight loss, thereby causing benign anorectal bleeding.  Plan:  He is agreeable to a colonoscopy after discussion of procedure and risks.  He is familiar with it, having had it done before.  The benefits and risks of the planned procedure were described in detail with the patient or (when appropriate) their health care proxy.  Risks were outlined as including, but not limited to, bleeding, infection, perforation, adverse medication reaction leading to cardiac or pulmonary decompensation, or pancreatitis (if ERCP).  The limitation of incomplete mucosal visualization was also discussed.  No guarantees or warranties were given.   Thank you for the courtesy of this consult.  Please call me with any questions or concerns.  Frank Monroe  CC: Kathalene Frames, FNP   Addendum 02/13/18:  Records rec'd from [facility] Alyson Ingles, MD performed EGD and colonoscopy 08/11/15  EGD with antral erythema, biopsy negative for H. Pylori Colonoscopy with "hemorrhoids"  H. Myrtie Neither, MD

## 2018-02-06 ENCOUNTER — Encounter: Payer: Self-pay | Admitting: Gastroenterology

## 2018-02-09 NOTE — Addendum Note (Signed)
Addended by: Berniece Pap on: 02/09/2018 09:53 AM   Modules accepted: Orders

## 2018-02-12 ENCOUNTER — Other Ambulatory Visit: Payer: Self-pay

## 2018-02-12 DIAGNOSIS — R194 Change in bowel habit: Secondary | ICD-10-CM

## 2018-02-12 DIAGNOSIS — K625 Hemorrhage of anus and rectum: Secondary | ICD-10-CM

## 2018-02-12 NOTE — Progress Notes (Unsigned)
Lab visit only. 

## 2018-02-13 LAB — CBC WITH DIFFERENTIAL/PLATELET
BASOS ABS: 0 10*3/uL (ref 0.0–0.2)
Basos: 1 %
EOS (ABSOLUTE): 0.3 10*3/uL (ref 0.0–0.4)
Eos: 5 %
Hematocrit: 45.8 % (ref 37.5–51.0)
Hemoglobin: 15.3 g/dL (ref 13.0–17.7)
IMMATURE GRANS (ABS): 0 10*3/uL (ref 0.0–0.1)
Immature Granulocytes: 0 %
LYMPHS ABS: 2.4 10*3/uL (ref 0.7–3.1)
LYMPHS: 41 %
MCH: 30.8 pg (ref 26.6–33.0)
MCHC: 33.4 g/dL (ref 31.5–35.7)
MCV: 92 fL (ref 79–97)
MONOS ABS: 0.6 10*3/uL (ref 0.1–0.9)
Monocytes: 10 %
NEUTROS ABS: 2.6 10*3/uL (ref 1.4–7.0)
Neutrophils: 43 %
PLATELETS: 236 10*3/uL (ref 150–379)
RBC: 4.97 x10E6/uL (ref 4.14–5.80)
RDW: 13.4 % (ref 12.3–15.4)
WBC: 5.8 10*3/uL (ref 3.4–10.8)

## 2018-02-13 LAB — COMPREHENSIVE METABOLIC PANEL
ALT: 18 IU/L (ref 0–44)
AST: 19 IU/L (ref 0–40)
Albumin/Globulin Ratio: 2.4 — ABNORMAL HIGH (ref 1.2–2.2)
Albumin: 4.7 g/dL (ref 3.5–5.5)
Alkaline Phosphatase: 82 IU/L (ref 39–117)
BILIRUBIN TOTAL: 0.9 mg/dL (ref 0.0–1.2)
BUN/Creatinine Ratio: 17 (ref 9–20)
BUN: 17 mg/dL (ref 6–20)
CHLORIDE: 104 mmol/L (ref 96–106)
CO2: 21 mmol/L (ref 20–29)
Calcium: 10 mg/dL (ref 8.7–10.2)
Creatinine, Ser: 1.02 mg/dL (ref 0.76–1.27)
GFR calc non Af Amer: 100 mL/min/{1.73_m2} (ref >59)
GFR, EST AFRICAN AMERICAN: 116 mL/min/{1.73_m2} (ref >59)
GLOBULIN, TOTAL: 2 g/dL (ref 1.5–4.5)
Glucose: 88 mg/dL (ref 65–99)
POTASSIUM: 4.3 mmol/L (ref 3.5–5.2)
Sodium: 140 mmol/L (ref 134–144)
TOTAL PROTEIN: 6.7 g/dL (ref 6.0–8.5)

## 2018-02-13 NOTE — Progress Notes (Signed)
Labs within normal range- was follow up on elevated liver enzymes-now returns to normal. No anemia with rectal bleeding. Also forwarded to GI Dr. Myrtie Neither as Lorain Childes.  Patient does not have PCP was given referral handout at last office for [facility] PCP if he desires or recommended provider of choice.

## 2018-02-18 ENCOUNTER — Ambulatory Visit (AMBULATORY_SURGERY_CENTER): Payer: BLUE CROSS/BLUE SHIELD | Admitting: Gastroenterology

## 2018-02-18 ENCOUNTER — Encounter: Payer: Self-pay | Admitting: Gastroenterology

## 2018-02-18 ENCOUNTER — Other Ambulatory Visit: Payer: Self-pay

## 2018-02-18 VITALS — BP 108/56 | HR 57 | Temp 98.4°F | Resp 16 | Ht 70.0 in | Wt 160.0 lb

## 2018-02-18 DIAGNOSIS — K625 Hemorrhage of anus and rectum: Secondary | ICD-10-CM

## 2018-02-18 DIAGNOSIS — R194 Change in bowel habit: Secondary | ICD-10-CM

## 2018-02-18 MED ORDER — SODIUM CHLORIDE 0.9 % IV SOLN: 500.0000 mL | Freq: Once | INTRAVENOUS | Status: AC

## 2018-02-18 NOTE — Progress Notes (Signed)
To PACU, VSS. Report to RN.tb 

## 2018-02-18 NOTE — Op Note (Signed)
[facility] Patient Name: Frank Monroe Procedure Date: 02/18/2018 10:25 AM MRN: [id number] Endoscopist: Sherilyn Cooter L. Myrtie Neither , MD Age: 27 Referring MD:  Date of Birth: Jul 09, 1991 Gender: Male Account #: [id number] Procedure:                Colonoscopy Indications:              Rectal bleeding Medicines:                Monitored Anesthesia Care Procedure:                Pre-Anesthesia Assessment:                           - Prior to the procedure, a History and Physical                            was performed, and patient medications and                            allergies were reviewed. The patient's tolerance of                            previous anesthesia was also reviewed. The risks                            and benefits of the procedure and the sedation                            options and risks were discussed with the patient.                            All questions were answered, and informed consent                            was obtained. Prior Anticoagulants: The patient has                            taken no previous anticoagulant or antiplatelet                            agents. ASA Grade Assessment: I - A normal, healthy                            patient. After reviewing the risks and benefits,                            the patient was deemed in satisfactory condition to                            undergo the procedure.                           After obtaining informed consent, the colonoscope  was passed under direct vision. Throughout the                            procedure, the patient's blood pressure, pulse, and                            oxygen saturations were monitored continuously. The                            Colonoscope was introduced through the anus and                            advanced to the the terminal ileum, with                            identification of the appendiceal orifice and IC            valve. The colonoscopy was performed without                            difficulty. The patient tolerated the procedure                            well. The quality of the bowel preparation was                            excellent. The terminal ileum, ileocecal valve,                            appendiceal orifice, and rectum were photographed.                            The quality of the bowel preparation was evaluated                            using the BBPS [facility] Bowel Preparation Scale)                            with scores of: Right Colon = 3, Transverse Colon =                            3 and Left Colon = 3 (entire mucosa seen well with                            no residual staining, small fragments of stool or                            opaque liquid). The total BBPS score equals 9. Scope In: 10:33:39 AM Scope Out: 10:44:04 AM Scope Withdrawal Time: 0 hours 7 minutes 8 seconds  Total Procedure Duration: 0 hours 10 minutes 25 seconds  Findings:                 The perianal and digital rectal examinations were  normal.                           The terminal ileum appeared normal.                           The entire examined colon appeared normal on direct                            and retroflexion views. Complications:            No immediate complications. Estimated Blood Loss:     Estimated blood loss: none. Impression:               - The examined portion of the ileum was normal.                           - The entire examined colon is normal on direct and                            retroflexion views.                           - No specimens collected. Recommendation:           - Patient has a contact number available for                            emergencies. The signs and symptoms of potential                            delayed complications were discussed with the                            patient. Return to normal activities  tomorrow.                            Written discharge instructions were provided to the                            patient.                           - Resume previous diet.                           - Continue present medications.                           - No recommendation at this time regarding repeat                            colonoscopy due to young age.                           - Return to my office as needed.                           -  High fiber diet to maintain regularity and avoid                            constipation-related anal bleeding.  L. Myrtie Neither, MD 02/18/2018 10:49:41 AM This report has been signed electronically.

## 2018-02-18 NOTE — Patient Instructions (Signed)
*  Read handout on high-fiber diet   YOU HAD AN ENDOSCOPIC PROCEDURE TODAY: Refer to the procedure report and other information in the discharge instructions given to you for any specific questions about what was found during the examination. If this information does not answer your questions, please call [place] office at [phone] to clarify.   YOU SHOULD EXPECT: Some feelings of bloating in the abdomen. Passage of more gas than usual. Walking can help get rid of the air that was put into your GI tract during the procedure and reduce the bloating. If you had a lower endoscopy (such as a colonoscopy or flexible sigmoidoscopy) you may notice spotting of blood in your stool or on the toilet paper. Some abdominal soreness may be present for a day or two, also.  DIET: Your first meal following the procedure should be a light meal and then it is ok to progress to your normal diet. A half-sandwich or bowl of soup is an example of a good first meal. Heavy or fried foods are harder to digest and may make you feel nauseous or bloated. Drink plenty of fluids but you should avoid alcoholic beverages for 24 hours. If you had a esophageal dilation, please see attached instructions for diet.    ACTIVITY: Your care partner should take you home directly after the procedure. You should plan to take it easy, moving slowly for the rest of the day. You can resume normal activity the day after the procedure however YOU SHOULD NOT DRIVE, use power tools, machinery or perform tasks that involve climbing or major physical exertion for 24 hours (because of the sedation medicines used during the test).   SYMPTOMS TO REPORT IMMEDIATELY: A gastroenterologist can be reached at any hour. Please call [phone]  for any of the following symptoms:  Following lower endoscopy (colonoscopy, flexible sigmoidoscopy) Excessive amounts of blood in the stool  Significant tenderness, worsening of abdominal pains  Swelling of the abdomen  that is new, acute  Fever of 100 or higher    FOLLOW UP:  If any biopsies were taken you will be contacted by phone or by letter within the next 1-3 weeks. Call [phone]  if you have not heard about the biopsies in 3 weeks.  Please also call with any specific questions about appointments or follow up tests.

## 2018-02-18 NOTE — Progress Notes (Signed)
No egg or soy allergy.  Pt drank 4-5 ounces of water at 830 am- Dr Myrtie Neither and CRNA notified- will not sedate until 1030 am- pt made aware - he also ate 11 am Tuesday ice cream- they were notified of this with no new orders

## 2018-02-19 ENCOUNTER — Telehealth: Payer: Self-pay

## 2018-02-19 NOTE — Telephone Encounter (Signed)
  Follow up Call-  Call Frank Monroe number 02/18/2018  Post procedure Call [name] phone  # [phone]  Permission to leave phone message Yes     Patient questions:  Do you have a fever, pain , or abdominal swelling? No. Pain Score  0 *  Have you tolerated food without any problems? Yes.    Have you been able to return to your normal activities? Yes.    Do you have any questions about your discharge instructions: Diet   No. Medications  No. Follow up visit  No.  Do you have questions or concerns about your Care? No.  Actions: * If pain score is 4 or above: No action needed, pain <4.

## 2018-06-30 ENCOUNTER — Telehealth: Payer: Self-pay | Admitting: Medical

## 2018-06-30 ENCOUNTER — Encounter: Payer: Self-pay | Admitting: Medical

## 2018-06-30 ENCOUNTER — Ambulatory Visit: Payer: Self-pay | Admitting: Medical

## 2018-06-30 VITALS — BP 134/70 | HR 67 | Temp 98.0°F | Resp 16 | Wt 157.0 lb

## 2018-06-30 DIAGNOSIS — J0100 Acute maxillary sinusitis, unspecified: Secondary | ICD-10-CM

## 2018-06-30 DIAGNOSIS — H6983 Other specified disorders of Eustachian tube, bilateral: Secondary | ICD-10-CM

## 2018-06-30 MED ORDER — FLUTICASONE PROPIONATE 50 MCG/ACT NA SUSP: 2.0000 | Freq: Every day | NASAL | 1 refills | Status: DC

## 2018-06-30 MED ORDER — AMOXICILLIN-POT CLAVULANATE 875-125 MG PO TABS: 1.0000 | ORAL_TABLET | Freq: Two times a day (BID) | ORAL | 0 refills | Status: DC

## 2018-06-30 NOTE — Progress Notes (Signed)
Subjective:    Patient ID: Frank Monroe, male    DOB: Feb 22, 1991, 27 y.o.   MRN: [id number]  HPI 27 yo in non acute distress. Started on Saturday with sore throat and mucus in the throat, nasal congestion , green and clear, facial pain  In cheeks and headache frontal sinus area , right side of face and back of head. Also teeth pain right upper teeth have been aching.  Took nothing Ibprofen 800mg  and oragel on there right upper teeth. Has a  Dentist (Dr. Toni Arthurs office).Told he had mucus in sinus cavities. Having filling replaced. Temperature  99. 5 yesterday.  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  Blood pressure 134/70, pulse 67, temperature 98 F (36.7 C), temperature source Oral, resp. rate 16, weight 157 lb (71.2 kg), SpO2 100 %.  Review of Systems  Constitutional: Positive for fatigue. Negative for chills and fever.  HENT: Positive for congestion, dental problem (pain on upper  teeth right side), ear pain (has pressure on the right side), postnasal drip, rhinorrhea, sinus pressure, sinus pain, sneezing, sore throat (better today) and voice change (laryngitis yesterday in the morning). Negative for ear discharge, facial swelling, hearing loss, nosebleeds and trouble swallowing.   Eyes: Positive for pain (pressure right > left). Negative for photophobia, discharge, redness, itching and visual disturbance.  Respiratory: Positive for cough. Negative for shortness of breath.   Cardiovascular: Negative for chest pain.  Gastrointestinal: Positive for diarrhea. Negative for abdominal pain.  Endocrine: Negative for polydipsia, polyphagia and polyuria.  Genitourinary: Negative for dysuria.  Musculoskeletal: Negative for myalgias.  Skin: Negative for rash.  Allergic/Immunologic: Negative.   Neurological: Positive for light-headedness (Sunday after playing kickball). Negative for dizziness and syncope.  Hematological: Negative for adenopathy.  Psychiatric/Behavioral:  Negative.        Objective:   Physical Exam  Constitutional: He is oriented to person, place, and time. He appears well-developed and well-nourished.  HENT:  Head: Normocephalic and atraumatic.  Right Ear: Hearing, external ear and ear canal normal. A middle ear effusion is present.  Left Ear: Hearing, external ear and ear canal normal. A middle ear effusion is present.  Nose: Mucosal edema and rhinorrhea present. Right sinus exhibits maxillary sinus tenderness and frontal sinus tenderness.  Mouth/Throat: Uvula is midline, oropharynx is clear and moist and mucous membranes are normal. Tonsils are 1+ on the right. Tonsils are 1+ on the left.  Eyes: Pupils are equal, round, and reactive to light. Conjunctivae and EOM are normal.  Neck: Normal range of motion. Neck supple.  Cardiovascular: Normal rate, regular rhythm and normal heart sounds.  Pulmonary/Chest: Effort normal and breath sounds normal.  Neurological: He is alert and oriented to person, place, and time.  Skin: Skin is warm and dry.  Psychiatric: He has a normal mood and affect. His behavior is normal. Judgment and thought content normal.  Nursing note and vitals reviewed.         Assessment & Plan:  Sinusitis most likely viral but with right sided facial pain forehead and maxillary and teeth pain I will place patient on Antibiotics.  Meds ordered this encounter  Medications  . amoxicillin-clavulanate (AUGMENTIN) 875-125 MG tablet    Sig: Take 1 tablet by mouth 2 (two) times daily.    Dispense:  20 tablet    Refill:  0  . fluticasone (FLONASE) 50 MCG/ACT nasal spray    Sig: Place 2 sprays into both nostrils daily.    Dispense:  16 g  Refill:  1  Return in  3-5 days if not improving. Patient verbalizes understanding and has no questions at discharge. If worsening and the clinic is not open, he is to seek out medical care at an urgent care clinic or the Emergency Department.

## 2018-06-30 NOTE — Patient Instructions (Signed)
Sinusitis, Adult Sinusitis is soreness and inflammation of your sinuses. Sinuses are hollow spaces in the bones around your face. They are located:  Around your eyes.  In the middle of your forehead.  Behind your nose.  In your cheekbones.  Your sinuses and nasal passages are lined with a stringy fluid (mucus). Mucus normally drains out of your sinuses. When your nasal tissues get inflamed or swollen, the mucus can get trapped or blocked so air cannot flow through your sinuses. This lets bacteria, viruses, and funguses grow, and that leads to infection. Follow these instructions at home: Medicines  Take, use, or apply over-the-counter and prescription medicines only as told by your doctor. These may include nasal sprays.  If you were prescribed an antibiotic medicine, take it as told by your doctor. Do not stop taking the antibiotic even if you start to feel better. Hydrate and Humidify  Drink enough water to keep your pee (urine) clear or pale yellow.  Use a cool mist humidifier to keep the humidity level in your home above 50%.  Breathe in steam for 10-15 minutes, 3-4 times a day or as told by your doctor. You can do this in the bathroom while a hot shower is running.  Try not to spend time in cool or dry air. Rest  Rest as much as possible.  Sleep with your head raised (elevated).  Make sure to get enough sleep each night. General instructions  Put a warm, moist washcloth on your face 3-4 times a day or as told by your doctor. This will help with discomfort.  Wash your hands often with soap and water. If there is no soap and water, use hand sanitizer.  Do not smoke. Avoid being around people who are smoking (secondhand smoke).  Keep all follow-up visits as told by your doctor. This is important. Contact a doctor if:  You have a fever.  Your symptoms get worse.  Your symptoms do not get better within 10 days. Get help right away if:  You have a very bad  headache.  You cannot stop throwing up (vomiting).  You have pain or swelling around your face or eyes.  You have trouble seeing.  You feel confused.  Your neck is stiff.  You have trouble breathing. This information is not intended to replace advice given to you by your health care provider. Make sure you discuss any questions you have with your health care provider. Document Released: 03/11/2008 Document Revised: 05/19/2016 Document Reviewed: 07/19/2015 Elsevier Interactive Patient Education  2018 [employer]. Amoxicillin; Clavulanic Acid tablets What is this medicine? AMOXICILLIN; CLAVULANIC ACID (a mox i SIL in; KLAV yoo lan ic AS id) is a penicillin antibiotic. It is used to treat certain kinds of bacterial infections. It will not work for colds, flu, or other viral infections. This medicine may be used for other purposes; ask your health care provider or pharmacist if you have questions. COMMON BRAND NAME(S): Augmentin What should I tell my health care provider before I take this medicine? They need to know if you have any of these conditions: -bowel disease, like colitis -kidney disease -liver disease -mononucleosis -an unusual or allergic reaction to amoxicillin, penicillin, cephalosporin, other antibiotics, clavulanic acid, other medicines, foods, dyes, or preservatives -pregnant or trying to get pregnant -breast-feeding How should I use this medicine? Take this medicine by mouth with a full glass of water. Follow the directions on the prescription label. Take at the start of a meal. Do not crush  or chew. If the tablet has a score line, you may cut it in half at the score line for easier swallowing. Take your medicine at regular intervals. Do not take your medicine more often than directed. Take all of your medicine as directed even if you think you are better. Do not skip doses or stop your medicine early. Talk to your pediatrician regarding the use of this medicine in  children. Special care may be needed. Overdosage: If you think you have taken too much of this medicine contact a poison control center or emergency room at once. NOTE: This medicine is only for you. Do not share this medicine with others. What if I miss a dose? If you miss a dose, take it as soon as you can. If it is almost time for your next dose, take only that dose. Do not take double or extra doses. What may interact with this medicine? -allopurinol -anticoagulants -birth control pills -methotrexate -probenecid This list may not describe all possible interactions. Give your health care provider a list of all the medicines, herbs, non-prescription drugs, or dietary supplements you use. Also tell them if you smoke, drink alcohol, or use illegal drugs. Some items may interact with your medicine. What should I watch for while using this medicine? Tell your doctor or health care professional if your symptoms do not improve. Do not treat diarrhea with over the counter products. Contact your doctor if you have diarrhea that lasts more than 2 days or if it is severe and watery. If you have diabetes, you may get a false-positive result for sugar in your urine. Check with your doctor or health care professional. Birth control pills may not work properly while you are taking this medicine. Talk to your doctor about using an extra method of birth control. What side effects may I notice from receiving this medicine? Side effects that you should report to your doctor or health care professional as soon as possible: -allergic reactions like skin rash, itching or hives, swelling of the face, lips, or tongue -breathing problems -dark urine -fever or chills, sore throat -redness, blistering, peeling or loosening of the skin, including inside the mouth -seizures -trouble passing urine or change in the amount of urine -unusual bleeding, bruising -unusually weak or tired -white patches or sores in the  mouth or throat Side effects that usually do not require medical attention (report to your doctor or health care professional if they continue or are bothersome): -diarrhea -dizziness -headache -nausea, vomiting -stomach upset -vaginal or anal irritation This list may not describe all possible side effects. Call your doctor for medical advice about side effects. You may report side effects to FDA at 1-800-FDA-1088. Where should I keep my medicine? Keep out of the reach of children. Store at room temperature below 25 degrees C (77 degrees F). Keep container tightly closed. Throw away any unused medicine after the expiration date. NOTE: This sheet is a summary. It may not cover all possible information. If you have questions about this medicine, talk to your doctor, pharmacist, or health care provider.  2018 Elsevier/Gold Standard (2007-12-17 12:04:30) Fluticasone nasal spray What is this medicine? FLUTICASONE (floo TIK a sone) is a corticosteroid. This medicine is used to treat the symptoms of allergies like sneezing, itchy red eyes, and itchy, runny, or stuffy nose. This medicine is also used to treat nasal polyps. This medicine may be used for other purposes; ask your health care provider or pharmacist if you have questions. COMMON BRAND  NAME(S): Flonase, Flonase Allergy Relief, Flonase Sensimist, Veramyst, XHANCE What should I tell my health care provider before I take this medicine? They need to know if you have any of these conditions: -cataracts -glaucoma -infection, like tuberculosis, herpes, or fungal infection -recent surgery on nose or sinuses -taking a corticosteroid by mouth -an unusual or allergic reaction to fluticasone, steroids, other medicines, foods, dyes, or preservatives -pregnant or trying to get pregnant -breast-feeding How should I use this medicine? This medicine is for use in the nose. Follow the directions on your product or prescription label. This medicine  works best if used at regular intervals. Do not use more often than directed. Make sure that you are using your nasal spray correctly. After 6 months of daily use for allergies, talk to your doctor or health care professional before using it for a longer time. Ask your doctor or health care professional if you have any questions. Talk to your pediatrician regarding the use of this medicine in children. Special care may be needed. Some products have been used for allergies in children as young as 2 years. After 2 months of daily use without a prescription in a child, talk to your pediatrician before using it for a longer time. Use of this medicine for nasal polyps is not approved in children. Overdosage: If you think you have taken too much of this medicine contact a poison control center or emergency room at once. NOTE: This medicine is only for you. Do not share this medicine with others. What if I miss a dose? If you miss a dose, use it as soon as you remember. If it is almost time for your next dose, use only that dose and continue with your regular schedule. Do not use double or extra doses. What may interact with this medicine? -certain antibiotics like clarithromycin and telithromycin -certain medicines for fungal infections like ketoconazole, itraconazole, and voriconazole -conivaptan -nefazodone -some medicines for HIV -vaccines This list may not describe all possible interactions. Give your health care provider a list of all the medicines, herbs, non-prescription drugs, or dietary supplements you use. Also tell them if you smoke, drink alcohol, or use illegal drugs. Some items may interact with your medicine. What should I watch for while using this medicine? Visit your doctor or health care professional for regular checks on your progress. Some symptoms may improve within 12 hours after starting use. Check with your doctor or health care professional if there is no improvement in your  symptoms after 3 weeks of use. This medicine may increase your risk of getting an infection. Tell your doctor or health care professional if you are around anyone with measles or chickenpox, or if you develop sores or blisters that do not heal properly. What side effects may I notice from receiving this medicine? Side effects that you should report to your doctor or health care professional as soon as possible: -allergic reactions like skin rash, itching or hives, swelling of the face, lips, or tongue -changes in vision -crusting or sores in the nose -nosebleed -signs and symptoms of infection like fever or chills; cough; sore throat -white patches or sores in the mouth or nose Side effects that usually do not require medical attention (report to your doctor or health care professional if they continue or are bothersome): -burning or irritation inside the nose or throat -cough -headache -unusual taste or smell This list may not describe all possible side effects. Call your doctor for medical advice about side effects.  You may report side effects to FDA at 1-800-FDA-1088. Where should I keep my medicine? Keep out of the reach of children. Store at room temperature between 15 and 30 degrees C (59 and 86 degrees F). Avoid exposure to extreme heat, cold, or light. Throw away any unused medicine after the expiration date. NOTE: This sheet is a summary. It may not cover all possible information. If you have questions about this medicine, talk to your doctor, pharmacist, or health care provider.  2018 Elsevier/Gold Standard (2016-07-05 14:23:12) Sinusitis, Adult Sinusitis is soreness and inflammation of your sinuses. Sinuses are hollow spaces in the bones around your face. They are located:  Around your eyes.  In the middle of your forehead.  Behind your nose.  In your cheekbones.  Your sinuses and nasal passages are lined with a stringy fluid (mucus). Mucus normally drains out of your  sinuses. When your nasal tissues get inflamed or swollen, the mucus can get trapped or blocked so air cannot flow through your sinuses. This lets bacteria, viruses, and funguses grow, and that leads to infection. Follow these instructions at home: Medicines  Take, use, or apply over-the-counter and prescription medicines only as told by your doctor. These may include nasal sprays.  If you were prescribed an antibiotic medicine, take it as told by your doctor. Do not stop taking the antibiotic even if you start to feel better. Hydrate and Humidify  Drink enough water to keep your pee (urine) clear or pale yellow.  Use a cool mist humidifier to keep the humidity level in your home above 50%.  Breathe in steam for 10-15 minutes, 3-4 times a day or as told by your doctor. You can do this in the bathroom while a hot shower is running.  Try not to spend time in cool or dry air. Rest  Rest as much as possible.  Sleep with your head raised (elevated).  Make sure to get enough sleep each night. General instructions  Put a warm, moist washcloth on your face 3-4 times a day or as told by your doctor. This will help with discomfort.  Wash your hands often with soap and water. If there is no soap and water, use hand sanitizer.  Do not smoke. Avoid being around people who are smoking (secondhand smoke).  Keep all follow-up visits as told by your doctor. This is important. Contact a doctor if:  You have a fever.  Your symptoms get worse.  Your symptoms do not get better within 10 days. Get help right away if:  You have a very bad headache.  You cannot stop throwing up (vomiting).  You have pain or swelling around your face or eyes.  You have trouble seeing.  You feel confused.  Your neck is stiff.  You have trouble breathing. This information is not intended to replace advice given to you by your health care provider. Make sure you discuss any questions you have with your  health care provider. Document Released: 03/11/2008 Document Revised: 05/19/2016 Document Reviewed: 07/19/2015 Elsevier Interactive Patient Education  [employer].

## 2018-06-30 NOTE — Telephone Encounter (Signed)
Patient called and said his antibiotics were not at the pharmacy. ( I had clarified [facility] to denote that it was the [facility] off [address].   Called customer care of [facility] and they said [place] 2 [place]. I then was transferred to his [facility]. They did have his prescription and it was ready.  Called Joselyn Glassmanyler and let him know that his antibiotics were ready.  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   Recommended that he contact the [facility] who is doing the testing and managing cases.   He said he was waiting for a return call from  Rosine Doorean JL Patterson. And he would do what she would suggest.. It is noted Tamela GammonDean Patterson has been in contact with the [facility] Dr. Andrey SpearmanArchinal and the [facility] for plans on handling direct exposures.  I told Joselyn Glassmanyler if he needs any further assistance to not hesitate to contact me. He verbalizes understanding and has no questions at the end of our conversation.

## 2018-07-02 ENCOUNTER — Ambulatory Visit: Payer: Self-pay | Admitting: Medical

## 2018-07-02 VITALS — BP 130/78 | HR 72 | Temp 97.3°F

## 2018-07-02 DIAGNOSIS — R51 Headache: Principal | ICD-10-CM

## 2018-07-02 DIAGNOSIS — R519 Headache, unspecified: Secondary | ICD-10-CM

## 2018-07-02 MED ORDER — KETOROLAC TROMETHAMINE 60 MG/2ML IM SOLN
60.0000 mg | Freq: Once | INTRAMUSCULAR | Status: AC
  Administered 2018-07-02: 60 mg via INTRAMUSCULAR

## 2018-07-02 NOTE — Progress Notes (Signed)
   Subjective:    Patient ID: Frank Monroe, male    DOB: 10-09-1990, 27 y.o.   MRN: [id number]  HPI  27 yo male in non acute distress.    Patient called me and said he is in severe pain on the right side of his face.  He also stated he was in the room of the the patient diiagnosed with the mumps. I asked him to come to the clinic To be evaluated by the me. Contacted  [facility] Wyvonna Plum RN, Health Department personal in charge of collecting samples. I had seen patient 2 days before and diagnosed him with a maxillary sinusitis, right side. He was placed on Augmentin  875mg -125mg . He has had 5 doses of the medication. He denies fever or chills , he states his pain was a  9/10 this morning and it is now a 3-5/10 if he moves around.   72HR,  99% RA, 130/78, 97.3 degrees F   Review of Systems Facial pain right side He states it feels like the right side of his face is swollen   Denies fever or chills. Objective:   Physical Exam  Constitutional: He is oriented to person, place, and time. He appears well-developed and well-nourished.  HENT:  Head: Normocephalic and atraumatic.  Right Ear: External ear normal.  Left Ear: External ear normal.  Mouth/Throat: Oropharynx is clear and moist.  Eyes: Pupils are equal, round, and reactive to light. Conjunctivae and EOM are normal.  Neck: Normal range of motion. Neck supple.  Neurological: He is alert and oriented to person, place, and time.  Skin: Skin is warm and dry.  Psychiatric: He has a normal mood and affect. His behavior is normal. Judgment and thought content normal.  Nursing note and vitals reviewed.    Mild swelling noted on the right lower side of face compared to the left side. No abscess seen on exam around  gumline around teeth lateral or medial.tongue wnl, tonsils wnl.No redness to the face.      Assessment & Plan:   Parotitis Possible Mumps,  Notified Dr. Andrey Spearman, AHCD here giving vaccines at university, came by and   swabbed   patient, by Lorene Dy C. Gerilyn Pilgrim Charity fundraiser. He is to be in isolation till he hears from the [facility] Department tomorrow they will also call me. My Cell phone given to them. If positive he will need to be in isolation x 6 days after facial swelling. Given Toradol 60mg  IM monitored for 15 minutes.Tylenol 1 gram po.He is to continue there ibuprofen 800mg  every 8 hours with food and Tylenol 1 gram every 8 hours as needed for pain. He is to continue his Augmentin. Dr. Sullivan Lone called made aware and reviewed patient with him. Patient verbilizes understanding and has no questions at discharge.

## 2018-07-02 NOTE — Patient Instructions (Addendum)
Parotitis Parotitis means that you have irritation and swelling (inflammation) in one or both of your parotid glands. These glands make spit (saliva). They are found on each side of your face, below and in front of your earlobes. You may or may not have pain with this condition. Follow these instructions at home: Medicines  Take over-the-counter and prescription medicines only as told by your doctor.  If you were prescribed an antibiotic medicine, take it as told by your doctor. Do not stop taking the antibiotic even if you start to feel better. Managing pain and swelling  Apply warm cloths (compresses) to the swollen area as told by your doctor.  Gently rub your parotid glands as told by your doctor. General instructions   Drink enough fluid to keep your pee (urine) clear or pale yellow.  Suck on sour candy. This may help: ? To make your mouth less dry. ? To make more spit.  Keep your mouth clean and moist. ? Gargle with a salt-water mixture 3-4 times per day, or as needed. ? To make a salt-water mixture, stir -1 tsp of salt into 1 cup of warm water.  Take good care of your mouth: ? Brush your teeth at least two times per day. ? Floss your teeth every day. ? See your dentist regularly.  Do not use tobacco products. These include cigarettes, chewing tobacco, or e-cigarettes. If you need help quitting,  ask your doctor.  Keep all follow-up visits as told by your doctor. This is important. Contact a doctor if:  You have a fever or chills.  You have new symptoms.  Your symptoms get worse.  Your symptoms do not get better with treatment. This information is not intended to replace advice given to you by your health care provider. Make sure you discuss any questions you have with your health care provider. Document Released: 10/26/2010 Document Revised: 02/29/2016 Document Reviewed: 02/16/2015 Elsevier Interactive Patient Education  2018 Else[employer] Inc.  

## 2018-07-04 ENCOUNTER — Telehealth: Payer: Self-pay | Admitting: Medical

## 2018-07-04 NOTE — Telephone Encounter (Signed)
Called patient to see how he is doing.  Currently in isolation due to mumps exposure.  He is feeling better, he states he cannot tell if his face right side iis swollen. No fever or chills. And his pain is under control.  He states the Toradol helped his pain. And he is now over lapping Ibuprofen and Tylenol. He states his right side of his mouth is still sore. I recommended he not chew on that side so as not to aggravate the nerves on the right side. He verbalizes understanding and will make an appointment with me for Wednesday when he is out of isolation.He has no questions at the end of our conversation.

## 2018-07-05 ENCOUNTER — Encounter: Payer: Self-pay | Admitting: Medical

## 2018-07-07 ENCOUNTER — Telehealth: Payer: Self-pay | Admitting: Medical

## 2018-07-07 NOTE — Telephone Encounter (Signed)
Feeling well today, no more swelling noted to his face. He has continued to take the Augmentin and has felt good. No fever or chills or teeth pain.  He wanted a recheck before he left but my schedule was full. He is off of quarantine for possible exposure to the mumps. tomorrow at noon and traveling Thrusday thru Sunday to [place].  We will do a follow up on Monday 07/13/18.  He verbalizes understanding and has no questions at the end of our conversation.

## 2018-07-10 NOTE — Progress Notes (Signed)
Patient ID: Frank Monroe, male   DOB: 12-30-1990, 27 y.o.   MRN: [id number]  Received fax from [facility]: MUMPS PCR ** resulted as mumps RNA NOT detected.,   Viral culture is listed as: Pending.   Given for label and then Joen Laura RN will scan under media tab.   Patient has already been notified of results.

## 2018-07-13 ENCOUNTER — Encounter: Payer: Self-pay | Admitting: Medical

## 2018-07-13 ENCOUNTER — Ambulatory Visit: Payer: Self-pay | Admitting: Medical

## 2018-07-13 VITALS — BP 129/73 | HR 63 | Temp 97.2°F | Resp 16 | Wt 158.0 lb

## 2018-07-13 DIAGNOSIS — Z0000 Encounter for general adult medical examination without abnormal findings: Secondary | ICD-10-CM

## 2018-07-13 NOTE — Progress Notes (Signed)
   Subjective:    Patient ID: Ethelene Browns, male    DOB: 13-May-1991, 27 y.o.   MRN: [id number]  HPI  27 yo male in non acute distress.  Feels good, no fevers no chills. Came in for recheck. Traveled to  [place]. For vacation , he states" it was not long enough".   Blood pressure 129/73, pulse 63, temperature (!) 97.2 F (36.2 C), temperature source Tympanic, resp. rate 16, weight 158 lb (71.7 kg), SpO2 100 %.  Review of Systems  HENT: Positive for rhinorrhea (rarely green mostly clear). Negative for sinus pressure, sinus pain and sore throat.   Respiratory: Positive for cough (slight, non productive). Negative for shortness of breath.   Cardiovascular: Negative for chest pain.  Gastrointestinal: Negative for abdominal pain.  Endocrine: Negative for polydipsia, polyphagia and polyuria.  Genitourinary: Negative for dysuria.  Musculoskeletal: Negative for myalgias.  Skin: Negative for rash.  Allergic/Immunologic: Negative for environmental allergies and food allergies.  Neurological: Negative for dizziness, syncope, facial asymmetry and light-headedness.  Hematological: Negative for adenopathy.  Psychiatric/Behavioral: Negative for behavioral problems, confusion, self-injury and suicidal ideas. The patient is not nervous/anxious.        Objective:   Physical Exam  Constitutional: He is oriented to person, place, and time. He appears well-developed and well-nourished.  HENT:  Head: Normocephalic and atraumatic.  Right Ear: External ear normal.  Left Ear: External ear normal.  Nose: Nose normal.  Mouth/Throat: Oropharynx is clear and moist.  Eyes: Pupils are equal, round, and reactive to light. Conjunctivae and EOM are normal.  Neck: Normal range of motion. Neck supple.  Cardiovascular: Normal rate, regular rhythm and normal heart sounds.  Pulmonary/Chest: Effort normal and breath sounds normal.  Lymphadenopathy:    He has no cervical adenopathy.  Neurological: He is alert and  oriented to person, place, and time.  Skin: Skin is warm and dry.  Psychiatric: He has a normal mood and affect. His behavior is normal. Judgment and thought content normal.  Nursing note and vitals reviewed.  No more teeth pain.       Assessment & Plan:  Sinusitis resolved. Facial swelling resolved. Return to the clinic as needed. He verbalizes understaniing and has no questions at discharge.

## 2019-07-08 ENCOUNTER — Other Ambulatory Visit: Payer: Self-pay

## 2019-07-08 DIAGNOSIS — Z20822 Contact with and (suspected) exposure to covid-19: Secondary | ICD-10-CM

## 2019-07-09 LAB — NOVEL CORONAVIRUS, NAA: SARS-CoV-2, NAA: NOT DETECTED

## 2019-07-12 ENCOUNTER — Other Ambulatory Visit: Payer: Self-pay

## 2019-07-12 DIAGNOSIS — Z20822 Contact with and (suspected) exposure to covid-19: Secondary | ICD-10-CM

## 2019-07-14 LAB — NOVEL CORONAVIRUS, NAA: SARS-CoV-2, NAA: NOT DETECTED

## 2019-10-04 ENCOUNTER — Ambulatory Visit: Payer: BC Managed Care – PPO | Attending: Internal Medicine

## 2019-10-04 DIAGNOSIS — Z20822 Contact with and (suspected) exposure to covid-19: Secondary | ICD-10-CM

## 2019-10-06 LAB — NOVEL CORONAVIRUS, NAA: SARS-CoV-2, NAA: NOT DETECTED

## 2019-11-29 ENCOUNTER — Encounter: Payer: Self-pay | Admitting: Emergency Medicine

## 2019-11-29 ENCOUNTER — Emergency Department
Admission: EM | Admit: 2019-11-29 | Discharge: 2019-11-30 | Disposition: A | Discharge status: Home / Self Care | Payer: BC Managed Care – PPO | Attending: Emergency Medicine | Admitting: Emergency Medicine

## 2019-11-29 ENCOUNTER — Emergency Department: Payer: BC Managed Care – PPO

## 2019-11-29 ENCOUNTER — Other Ambulatory Visit: Payer: Self-pay

## 2019-11-29 DIAGNOSIS — R002 Palpitations: Secondary | ICD-10-CM | POA: Insufficient documentation

## 2019-11-29 LAB — TSH: TSH: 1.697 u[IU]/mL (ref 0.350–4.500)

## 2019-11-29 LAB — CBC
HCT: 43.2 % (ref 39.0–52.0)
Hemoglobin: 14.8 g/dL (ref 13.0–17.0)
MCH: 30.9 pg (ref 26.0–34.0)
MCHC: 34.3 g/dL (ref 30.0–36.0)
MCV: 90.2 fL (ref 80.0–100.0)
Platelets: 264 10*3/uL (ref 150–400)
RBC: 4.79 MIL/uL (ref 4.22–5.81)
RDW: 12.5 % (ref 11.5–15.5)
WBC: 7.5 10*3/uL (ref 4.0–10.5)
nRBC: 0 % (ref 0.0–0.2)

## 2019-11-29 LAB — BASIC METABOLIC PANEL
Anion gap: 9 (ref 5–15)
BUN: 16 mg/dL (ref 6–20)
CO2: 28 mmol/L (ref 22–32)
Calcium: 9.6 mg/dL (ref 8.9–10.3)
Chloride: 103 mmol/L (ref 98–111)
Creatinine, Ser: 0.94 mg/dL (ref 0.61–1.24)
GFR calc Af Amer: >60 mL/min (ref >60)
GFR calc non Af Amer: >60 mL/min (ref >60)
Glucose, Bld: 128 mg/dL — ABNORMAL HIGH (ref 70–99)
Potassium: 3.7 mmol/L (ref 3.5–5.1)
Sodium: 140 mmol/L (ref 135–145)

## 2019-11-29 LAB — T4, FREE: Free T4: 0.86 ng/dL (ref 0.61–1.12)

## 2019-11-29 LAB — TROPONIN I (HIGH SENSITIVITY): Troponin I (High Sensitivity): <2 ng/L (ref <18)

## 2019-11-29 MED ORDER — SODIUM CHLORIDE 0.9% FLUSH: 3.0000 mL | Freq: Once | INTRAVENOUS | Status: DC

## 2019-11-29 NOTE — ED Notes (Signed)
Pt denies cp, n/v, arm pain, or other symptoms. Pt states he feels his heart beating really loudly in his ears and that he can't sleep because of it.

## 2019-11-29 NOTE — ED Provider Notes (Signed)
[facility] Emergency Department Provider Note   ____________________________________________   First MD Initiated Contact with Patient 11/29/19 2308     (approximate)  I have reviewed the triage vital signs and the nursing notes.   HISTORY  Chief Complaint Palpitations    HPI Frank Monroe is a 29 y.o. male who presents to the ED from home with a chief complaint of "heart beating hard".  Symptoms onset Friday night while drinking alcohol.  Recurred again tonight while he was eating dinner.  Not associated with exertion.  Does not describe palpitations such as heart beating fast or irregularly.  Not associated with chest pain, diaphoresis, shortness of breath.  Denies recent fever, chills, abdominal pain, nausea, vomiting, dizziness.  Denies recent travel, trauma or hormone use.  Has had multiple negative Covid test as he works at [employer].  Over the Christmas holidays he was in [place] and had multiple negative Covid test there as well.  He was told that he had a gallop sound on his heart.  Has seen his PCP who did not hear a gallop and is scheduled for blood testing next week.       Past Medical History:  Diagnosis Date  . Rectal bleeding     Patient Active Problem List   Diagnosis Date Noted  . History of epistaxis 10/29/2017    Past Surgical History:  Procedure Laterality Date  . arm fracture    . COLONOSCOPY    . UPPER GASTROINTESTINAL ENDOSCOPY    . WISDOM TOOTH EXTRACTION      Prior to Admission medications   Medication Sig Start Date End Date Taking? Authorizing Provider  fluticasone (FLONASE) 50 MCG/ACT nasal spray Place 2 sprays into both nostrils daily. Patient not taking: Reported on 07/13/2018 06/30/18   Doy Mince, PA-C    Allergies Patient has no known allergies.  Family History  Problem Relation Age of Onset  . Ulcerative colitis Mother   . Colon cancer Neg Hx   . Colon polyps Neg Hx   . Rectal cancer Neg  Hx   . Stomach cancer Neg Hx     Social History Social History   Tobacco Use  . Smoking status: Never Smoker  . Smokeless tobacco: Never Used  Substance Use Topics  . Alcohol use: Yes    Comment: 5 drinks weekly  . Drug use: No  Very occasional energy drink use.  No coffee.  Review of Systems  Constitutional: No fever/chills Eyes: No visual changes. ENT: No sore throat. Cardiovascular: Positive for heart beating fast.  Denies chest pain. Respiratory: Denies shortness of breath. Gastrointestinal: No abdominal pain.  No nausea, no vomiting.  No diarrhea.  No constipation. Genitourinary: Negative for dysuria. Musculoskeletal: Negative for back pain. Skin: Negative for rash. Neurological: Negative for headaches, focal weakness or numbness.   ____________________________________________   PHYSICAL EXAM:  VITAL SIGNS: ED Triage Vitals  Enc Vitals Group     BP 11/29/19 2301 122/78     Pulse Rate 11/29/19 2301 73     Resp 11/29/19 2301 18     Temp --      Temp src --      SpO2 11/29/19 2301 100 %     Weight 11/29/19 2047 180 lb (81.6 kg)     Height 11/29/19 2047 5\' 10"  (1.778 m)     Head Circumference --      Peak Flow --      Pain Score 11/29/19 2047 0  Pain Loc --      Pain Edu? --      Excl. in GC? --     Constitutional: Alert and oriented. Well appearing and in no acute distress. Eyes: Conjunctivae are normal. PERRL. EOMI. Head: Atraumatic. Nose: No congestion/rhinnorhea. Mouth/Throat: Mucous membranes are moist.   Neck: No stridor.  No thyromegaly. Cardiovascular: Normal rate, regular rhythm. Grossly normal heart sounds.  Good peripheral circulation.  No abnormal heart sounds. Respiratory: Normal respiratory effort.  No retractions. Lungs CTAB. Gastrointestinal: Soft and nontender. No distention. No abdominal bruits. No CVA tenderness. Musculoskeletal: No lower extremity tenderness nor edema.  No joint effusions. Neurologic:  Normal speech and language.  No gross focal neurologic deficits are appreciated. No gait instability. Skin:  Skin is warm, dry and intact. No rash noted. Psychiatric: Mood and affect are normal. Speech and behavior are normal.  ____________________________________________   LABS (all labs ordered are listed, but only abnormal results are displayed)  Labs Reviewed  BASIC METABOLIC PANEL - Abnormal; Notable for the following components:      Result Value   Glucose, Bld 128 (*)    All other components within normal limits  CBC  TSH  T4, FREE  TROPONIN I (HIGH SENSITIVITY)  TROPONIN I (HIGH SENSITIVITY)   ____________________________________________  EKG  ED ECG REPORT I, Charlene Cowdrey J, the attending physician, personally viewed and interpreted this ECG.   Date: 11/29/2019  EKG Time: 2047  Rate: 77  Rhythm: normal EKG, normal sinus rhythm  Axis: Normal  Intervals:none  ST&T Change: Nonspecific  ____________________________________________  RADIOLOGY  ED MD interpretation: No acute cardiopulmonary process  Official radiology report(s): DG Chest 2 View  Result Date: 11/29/2019 CLINICAL DATA:  Palpitations. EXAM: CHEST - 2 VIEW COMPARISON:  No pertinent prior studies available for comparison. FINDINGS: Heart size within normal limits. No evidence of airspace consolidation within the lungs. No evidence of pleural effusion or pneumothorax. No acute bony abnormality. IMPRESSION: No evidence of acute cardiopulmonary abnormality. Electronically Signed   By: Jackey Loge DO   On: 11/29/2019 21:16    ____________________________________________   PROCEDURES  Procedure(s) performed (including Critical Care):  Procedures   ____________________________________________   INITIAL IMPRESSION / ASSESSMENT AND PLAN / ED COURSE  As part of my medical decision making, I reviewed the following data within the electronic MEDICAL RECORD NUMBER Nursing notes reviewed and incorporated, Labs reviewed, EKG interpreted,  Old chart reviewed, Radiograph reviewed and Notes from prior ED visits     Frank Monroe was evaluated in Emergency Department on 11/30/2019 for the symptoms described in the history of present illness. He was evaluated in the context of the global COVID-19 pandemic, which necessitated consideration that the patient might be at risk for infection with the SARS-CoV-2 virus that causes COVID-19. Institutional protocols and algorithms that pertain to the evaluation of patients at risk for COVID-19 are in a state of rapid change based on information released by regulatory bodies including the CDC and federal and state organizations. These policies and algorithms were followed during the patient's care in the ED.    29 year old otherwise healthy male who presents with feeling like his heart was pounding. Differential diagnosis includes, but is not limited to, ACS, aortic dissection, pulmonary embolism, cardiac tamponade, pneumothorax, pneumonia, pericarditis, myocarditis, GI-related causes including esophagitis/gastritis, and musculoskeletal chest wall pain.    Initial troponin and EKG unremarkable.  Will check repeat troponin, thyroid panel. Patient currently voices no complaints and states his symptoms have resolved.  Clinical Course as of  Nov 29 37  Tue Nov 30, 2019  0037 Patient resting no acute distress.  Updated him on all test results.  Will refer to cardiology for Holter monitor.  Strict return precautions given.  Patient verbalizes understanding and agrees with plan of care.   [JS]    Clinical Course User Index [JS] Paulette Blanch, MD     ____________________________________________   FINAL CLINICAL IMPRESSION(S) / ED DIAGNOSES  Final diagnoses:  Heart palpitations     ED Discharge Orders    None       Note:  This document was prepared using Dragon voice recognition software and may include unintentional dictation errors.   Paulette Blanch, MD 11/30/19 [phone]

## 2019-11-29 NOTE — ED Triage Notes (Signed)
Pt presents to ED with feeling like his heart is beating very hard. Onset approx 1 hour ago while he was eating dinner. Has happened the past few nights. Denies chest pain or sob.

## 2019-11-30 LAB — TROPONIN I (HIGH SENSITIVITY): Troponin I (High Sensitivity): <2 ng/L (ref <18)

## 2019-11-30 NOTE — Discharge Instructions (Addendum)
Decrease caffeine whenever possible.  Drink plenty of fluids daily.  Return to the ER for worsening symptoms, persistent vomiting, difficulty breathing or other concerns. 

## 2019-12-02 ENCOUNTER — Ambulatory Visit (INDEPENDENT_AMBULATORY_CARE_PROVIDER_SITE_OTHER): Payer: BC Managed Care – PPO | Admitting: Cardiology

## 2019-12-02 ENCOUNTER — Encounter: Payer: Self-pay | Admitting: Cardiology

## 2019-12-02 ENCOUNTER — Ambulatory Visit (INDEPENDENT_AMBULATORY_CARE_PROVIDER_SITE_OTHER): Payer: BC Managed Care – PPO

## 2019-12-02 ENCOUNTER — Other Ambulatory Visit: Payer: Self-pay

## 2019-12-02 VITALS — BP 116/66 | HR 72 | Ht 70.0 in | Wt 182.8 lb

## 2019-12-02 DIAGNOSIS — R002 Palpitations: Secondary | ICD-10-CM

## 2019-12-02 NOTE — Progress Notes (Signed)
Cardiology Office Note:    Date:  12/02/2019   ID:  Frank Monroe, DOB 1991/01/23, MRN [id number]  PCP:  Leilani Able, MD  Cardiologist:  Debbe Odea, MD  Electrophysiologist:  None   Referring MD: Leilani Able, MD   Chief Complaint  Patient presents with  . New Patient (Initial Visit)    Palpitations; Meds verbally reviewed with patient.   Frank Monroe is a 29 y.o. male who is being seen today for the evaluation of palpitations at the request of Leilani Able, MD.  History of Present Illness:    Frank Monroe is a 29 y.o. male with no significant past medical history who presents with palpitations.  Patient states having prominent heartbeat for about a month now.  He was Covid testing site a month ago in [place] when he was told his heartbeat was irregular.  Patient has been more conscious and anxious regarding his heart beat since then.  He states noticing more prominent heartbeats, occurring multiple times a week usually when he is about to go to sleep.  He denies fast heart rates but just states his heartbeat is stronger.  He denies any history of heart disease, denies chest pain or shortness of breath at rest or with exertion, denies presyncope or syncope.  Past Medical History:  Diagnosis Date  . Rectal bleeding     Past Surgical History:  Procedure Laterality Date  . arm fracture    . COLONOSCOPY    . UPPER GASTROINTESTINAL ENDOSCOPY    . WISDOM TOOTH EXTRACTION      Current Medications: No outpatient medications have been marked as taking for the 12/02/19 encounter (Office Visit) with Debbe Odea, MD.   Current Facility-Administered Medications for the 12/02/19 encounter (Office Visit) with Debbe Odea, MD  Medication  . 0.9 %  sodium chloride infusion     Allergies:   Patient has no known allergies.   Social History   Socioeconomic History  . Marital status: Single    Spouse name: Not on file  . Number of children: Not on file  . Years  of education: Not on file  . Highest education level: Master's degree (e.g., MA, MS, MEng, MEd, MSW, MBA)  Occupational History  . Not on file  Tobacco Use  . Smoking status: Never Smoker  . Smokeless tobacco: Never Used  Substance and Sexual Activity  . Alcohol use: Yes    Comment: 5 drinks weekly  . Drug use: No  . Sexual activity: Not on file  Other Topics Concern  . Not on file  Social History Narrative  . Not on file   Social Determinants of Health   Financial Resource Strain:   . Difficulty of Paying Living Expenses: Not on file  Food Insecurity:   . Worried About Programme researcher, broadcasting/film/video in the Last Year: Not on file  . Ran Out of Food in the Last Year: Not on file  Transportation Needs:   . Lack of Transportation (Medical): Not on file  . Lack of Transportation (Non-Medical): Not on file  Physical Activity:   . Days of Exercise per Week: Not on file  . Minutes of Exercise per Session: Not on file  Stress:   . Feeling of Stress : Not on file  Social Connections:   . Frequency of Communication with Friends and Family: Not on file  . Frequency of Social Gatherings with Friends and Family: Not on file  . Attends Religious Services: Not on file  . Active Member  of Clubs or Organizations: Not on file  . Attends Archivist Meetings: Not on file  . Marital Status: Not on file     Family History: The patient's family history includes Ulcerative colitis in his mother. There is no history of Colon cancer, Colon polyps, Rectal cancer, or Stomach cancer.  ROS:   Please see the history of present illness.     All other systems reviewed and are negative.  EKGs/Labs/Other Studies Reviewed:    The following studies were reviewed today:   EKG:  EKG is  ordered today.  The ekg ordered today demonstrates normal sinus rhythm, normal ECG.  Recent Labs: 11/29/2019: BUN 16; Creatinine, Ser 0.94; Hemoglobin 14.8; Platelets 264; Potassium 3.7; Sodium 140; TSH 1.697  Recent  Lipid Panel    Component Value Date/Time   CHOL 148 12/10/2017 0836   TRIG 43 12/10/2017 0836   HDL 48 12/10/2017 0836   CHOLHDL 3.1 12/10/2017 0836   LDLCALC 91 12/10/2017 0836    Physical Exam:    VS:  BP 116/66 (BP Location: Right Arm, Patient Position: Sitting, Cuff Size: Normal)   Pulse 72   Ht 5\' 10"  (1.778 m)   Wt 182 lb 12 oz (82.9 kg)   SpO2 98%   BMI 26.22 kg/m     Wt Readings from Last 3 Encounters:  12/02/19 182 lb 12 oz (82.9 kg)  11/29/19 180 lb (81.6 kg)  07/13/18 158 lb (71.7 kg)     GEN:  Well nourished, well developed in no acute distress HEENT: Normal NECK: No JVD; No carotid bruits LYMPHATICS: No lymphadenopathy CARDIAC: RRR, no murmurs, rubs, gallops RESPIRATORY:  Clear to auscultation without rales, wheezing or rhonchi  ABDOMEN: Soft, non-tender, non-distended MUSCULOSKELETAL:  No edema; No deformity  SKIN: Warm and dry NEUROLOGIC:  Alert and oriented x 3 PSYCHIATRIC:  Normal affect   ASSESSMENT:    1. Palpitations    PLAN:    In order of problems listed above:  1. Patient with history of prominent heartbeat.  It is possible his symptoms are anxiety related after being told he has irregular heartbeat.   will evaluate with cardiac monitor for any arrhythmias.  Follow-up after cardiac monitor in 1 month  This note was generated in part or whole with voice recognition software. Voice recognition is usually quite accurate but there are transcription errors that can and very often do occur. I apologize for any typographical errors that were not detected and corrected.  Medication Adjustments/Labs and Tests Ordered: Current medicines are reviewed at length with the patient today.  Concerns regarding medicines are outlined above.  Orders Placed This Encounter  Procedures  . LONG TERM MONITOR (3-14 DAYS)  . EKG 12-Lead   No orders of the defined types were placed in this encounter.   Patient Instructions  Medication Instructions:  No  changes  *If you need a refill on your cardiac medications before your next appointment, please call your pharmacy*  Lab Work: None  If you have labs (blood work) drawn today and your tests are completely normal, you will receive your results only by: Marland Kitchen MyChart Message (if you have MyChart) OR . A paper copy in the mail If you have any lab test that is abnormal or we need to change your treatment, we will call you to review the results.  Testing/Procedures: Your physician has recommended that you wear a Zio monitor. This monitor is a medical device that records the heart's electrical activity. Doctors most often use  these monitors to diagnose arrhythmias. Arrhythmias are problems with the speed or rhythm of the heartbeat. The monitor is a small device applied to your chest. You can wear one while you do your normal daily activities. While wearing this monitor if you have any symptoms to push the button and record what you felt. Once you have worn this monitor for the period of time provider prescribed (Usually 14 days), you will return the monitor device in the postage paid box. Once it is returned they will download the data collected and provide [place] with a report which the provider will then review and we will call you with those results. Important tips:  1. Avoid showering during the first 24 hours of wearing the monitor. 2. Avoid excessive sweating to help maximize wear time. 3. Do not submerge the device, no hot tubs, and no swimming pools. 4. Keep any lotions or oils away from the patch. 5. After 24 hours you may shower with the patch on. Take brief showers with your back facing the shower head.  6. Do not remove patch once it has been placed because that will interrupt data and decrease adhesive wear time. 7. Push the button when you have any symptoms and write down what you were feeling. 8. Once you have completed wearing your monitor, remove and place into box which has postage paid and  place in your outgoing mailbox.  9. If for some reason you have misplaced your box then call our office and we can provide another box and/or mail it off for you.        Follow-Up: At [facility], you and your health needs are our priority.  As part of our continuing mission to provide you with exceptional heart care, we have created designated Provider Care Teams.  These Care Teams include your primary Cardiologist (physician) and Advanced Practice Providers (APPs -  Physician Assistants and Nurse Practitioners) who all work together to provide you with the care you need, when you need it.  Your next appointment:   1 month(s)  The format for your next appointment:   In Person  Provider:    You may see Debbe Odea, MD or one of the following Advanced Practice Providers on your designated Care Team:    Nicolasa Ducking, NP  Eula Listen, PA-C  Marisue Ivan, PA-C     Palpitations Palpitations are feelings that your heartbeat is not normal. Your heartbeat may feel like it is:  Uneven.  Faster than normal.  Fluttering.  Skipping a beat. This is usually not a serious problem. In some cases, you may need tests to rule out any serious problems. Follow these instructions at home: Pay attention to any changes in your condition. Take these actions to help manage your symptoms: Eating and drinking  Avoid: ? Coffee, tea, soft drinks, and energy drinks. ? Chocolate. ? Alcohol. ? Diet pills. Lifestyle   Try to lower your stress. These things can help you relax: ? Yoga. ? Deep breathing and meditation. ? Exercise. ? Using words and images to create positive thoughts (guided imagery). ? Using your mind to control things in your body (biofeedback).  Do not use drugs.  Get plenty of rest and sleep. Keep a regular bed time. General instructions   Take over-the-counter and prescription medicines only as told by your doctor.  Do not use any products that  contain nicotine or tobacco, such as cigarettes and e-cigarettes. If you need help quitting, ask your doctor.  Keep  all follow-up visits as told by your doctor. This is important. You may need more tests if palpitations do not go away or get worse. Contact a doctor if:  Your symptoms last more than 24 hours.  Your symptoms occur more often. Get help right away if you:  Have chest pain.  Feel short of breath.  Have a very bad headache.  Feel dizzy.  Pass out (faint). Summary  Palpitations are feelings that your heartbeat is uneven or faster than normal. It may feel like your heart is fluttering or skipping a beat.  Avoid food and drinks that may cause palpitations. These include caffeine, chocolate, and alcohol.  Try to lower your stress. Do not smoke or use drugs.  Get help right away if you faint or have chest pain, shortness of breath, a severe headache, or dizziness. This information is not intended to replace advice given to you by your health care provider. Make sure you discuss any questions you have with your health care provider. Document Revised: 11/05/2017 Document Reviewed: 11/05/2017 Elsevier Patient Education  2020 [employer].     Signed, Debbe Odea, MD  12/02/2019 9:45 AM    [facility]

## 2019-12-02 NOTE — Patient Instructions (Addendum)
Medication Instructions:  No changes  *If you need a refill on your cardiac medications before your next appointment, please call your pharmacy*  Lab Work: None  If you have labs (blood work) drawn today and your tests are completely normal, you will receive your results only by: Marland Kitchen MyChart Message (if you have MyChart) OR . A paper copy in the mail If you have any lab test that is abnormal or we need to change your treatment, we will call you to review the results.  Testing/Procedures: Your physician has recommended that you wear a Zio monitor. This monitor is a medical device that records the heart's electrical activity. Doctors most often use these monitors to diagnose arrhythmias. Arrhythmias are problems with the speed or rhythm of the heartbeat. The monitor is a small device applied to your chest. You can wear one while you do your normal daily activities. While wearing this monitor if you have any symptoms to push the button and record what you felt. Once you have worn this monitor for the period of time provider prescribed (Usually 14 days), you will return the monitor device in the postage paid box. Once it is returned they will download the data collected and provide [place] with a report which the provider will then review and we will call you with those results. Important tips:  1. Avoid showering during the first 24 hours of wearing the monitor. 2. Avoid excessive sweating to help maximize wear time. 3. Do not submerge the device, no hot tubs, and no swimming pools. 4. Keep any lotions or oils away from the patch. 5. After 24 hours you may shower with the patch on. Take brief showers with your back facing the shower head.  6. Do not remove patch once it has been placed because that will interrupt data and decrease adhesive wear time. 7. Push the button when you have any symptoms and write down what you were feeling. 8. Once you have completed wearing your monitor, remove and place into  box which has postage paid and place in your outgoing mailbox.  9. If for some reason you have misplaced your box then call our office and we can provide another box and/or mail it off for you.        Follow-Up: At [facility], you and your health needs are our priority.  As part of our continuing mission to provide you with exceptional heart care, we have created designated Provider Care Teams.  These Care Teams include your primary Cardiologist (physician) and Advanced Practice Providers (APPs -  Physician Assistants and Nurse Practitioners) who all work together to provide you with the care you need, when you need it.  Your next appointment:   1 month(s)  The format for your next appointment:   In Person  Provider:    You may see Kate Sable, MD or one of the following Advanced Practice Providers on your designated Care Team:    Murray Hodgkins, NP  Christell Faith, PA-C  Marrianne Mood, PA-C     Palpitations Palpitations are feelings that your heartbeat is not normal. Your heartbeat may feel like it is:  Uneven.  Faster than normal.  Fluttering.  Skipping a beat. This is usually not a serious problem. In some cases, you may need tests to rule out any serious problems. Follow these instructions at home: Pay attention to any changes in your condition. Take these actions to help manage your symptoms: Eating and drinking  Avoid: ? Coffee, tea,  soft drinks, and energy drinks. ? Chocolate. ? Alcohol. ? Diet pills. Lifestyle   Try to lower your stress. These things can help you relax: ? Yoga. ? Deep breathing and meditation. ? Exercise. ? Using words and images to create positive thoughts (guided imagery). ? Using your mind to control things in your body (biofeedback).  Do not use drugs.  Get plenty of rest and sleep. Keep a regular bed time. General instructions   Take over-the-counter and prescription medicines only as told by your  doctor.  Do not use any products that contain nicotine or tobacco, such as cigarettes and e-cigarettes. If you need help quitting, ask your doctor.  Keep all follow-up visits as told by your doctor. This is important. You may need more tests if palpitations do not go away or get worse. Contact a doctor if:  Your symptoms last more than 24 hours.  Your symptoms occur more often. Get help right away if you:  Have chest pain.  Feel short of breath.  Have a very bad headache.  Feel dizzy.  Pass out (faint). Summary  Palpitations are feelings that your heartbeat is uneven or faster than normal. It may feel like your heart is fluttering or skipping a beat.  Avoid food and drinks that may cause palpitations. These include caffeine, chocolate, and alcohol.  Try to lower your stress. Do not smoke or use drugs.  Get help right away if you faint or have chest pain, shortness of breath, a severe headache, or dizziness. This information is not intended to replace advice given to you by your health care provider. Make sure you discuss any questions you have with your health care provider. Document Revised: 11/05/2017 Document Reviewed: 11/05/2017 Elsevier Patient Education  2020 [employer].

## 2019-12-27 NOTE — Progress Notes (Signed)
Cardiology Office Note    Date:  12/31/2019   ID:  Dat Derksen, DOB 1991/09/23, MRN [id number]  PCP:  Leilani Able, MD  Cardiologist:  Debbe Odea, MD  Electrophysiologist:  None   Chief Complaint: Follow-up  History of Present Illness:   Frank Monroe is a 29 y.o. male with history of palpitations presents for follow-up.  He reports being previously told his heartbeat was irregular while at a Covid testing site in [place] over the Christmas holiday.  Following this, he has been more conscientious of his heartbeat.  He was seen in the ED on 11/29/2019 with several episodes of palpitations described as "beating hard."  Work-up was unrevealing including laboratory evaluation as outlined below, chest x-ray, and EKG.  He was evaluated by Dr. Azucena Cecil on 12/02/2019 for these palpitations which would occur multiple times per week, usually when he was about to go to sleep.  EKG was again unrevealing showing sinus rhythm.  Outpatient cardiac monitoring showed SR with an average heart rate of 82 bpm with a range of 51 to 176 bpm.  Isolated PACs, atrial couplets, atrial triplets, and PVCs were rare, each representing less than 1% burden.  No significant arrhythmias were noted.  Patient triggered events corresponded with sinus rhythm.  He did notice a rash with his Zio patch.  He comes in doing well from a cardiac perspective.  Since he was last seen, he has noted an improvement in his palpitation burden.  He has decreased his caffeine intake.  Single-lead rhythm strips reviewed from his apple watch which show sinus arrhythmia versus sinus rhythm with occasional PACs.  He also has some grafts for review when he was regularly running, pre-Covid, which seem to demonstrate an exaggerated chronotropic response upon initiation of exercise.  He also notes some intermittent chest discomfort that does not seem to be associated with any particular activity or his palpitations.  He notes he was quite  anxious leading up to his initial presentation regarding potential jobs though this has subsequently improved.  No dizziness, presyncope, syncope.   Labs independently reviewed: 11/2019 - high-sensitivity troponin < 2 x 2, TSH normal, free T4 normal, HGB 14.8, PLT 264, potassium 3.7, BUN 16, serum creatinine 0.94  Past Medical History:  Diagnosis Date  . Rectal bleeding     Past Surgical History:  Procedure Laterality Date  . arm fracture    . COLONOSCOPY    . UPPER GASTROINTESTINAL ENDOSCOPY    . WISDOM TOOTH EXTRACTION      Current Medications: No outpatient medications have been marked as taking for the 12/31/19 encounter (Office Visit) with Sondra Barges, PA-C.   Current Facility-Administered Medications for the 12/31/19 encounter (Office Visit) with Sondra Barges, PA-C  Medication  . 0.9 %  sodium chloride infusion    Allergies:   Patient has no known allergies.   Social History   Socioeconomic History  . Marital status: Single    Spouse name: Not on file  . Number of children: Not on file  . Years of education: Not on file  . Highest education level: Master's degree (e.g., MA, MS, MEng, MEd, MSW, MBA)  Occupational History  . Not on file  Tobacco Use  . Smoking status: Never Smoker  . Smokeless tobacco: Never Used  Substance and Sexual Activity  . Alcohol use: Yes    Comment: 5 drinks weekly  . Drug use: No  . Sexual activity: Not on file  Other Topics Concern  . Not on  file  Social History Narrative  . Not on file   Social Determinants of Health   Financial Resource Strain:   . Difficulty of Paying Living Expenses:   Food Insecurity:   . Worried About Charity fundraiser in the Last Year:   . Arboriculturist in the Last Year:   Transportation Needs:   . Film/video editor (Medical):   Marland Kitchen Lack of Transportation (Non-Medical):   Physical Activity:   . Days of Exercise per Week:   . Minutes of Exercise per Session:   Stress:   . Feeling of Stress :    Social Connections:   . Frequency of Communication with Friends and Family:   . Frequency of Social Gatherings with Friends and Family:   . Attends Religious Services:   . Active Member of Clubs or Organizations:   . Attends Archivist Meetings:   Marland Kitchen Marital Status:      Family History:  The patient's family history includes Ulcerative colitis in his mother. There is no history of Colon cancer, Colon polyps, Rectal cancer, or Stomach cancer.  ROS:   Review of Systems  Constitutional: Negative for chills, diaphoresis, fever, malaise/fatigue and weight loss.  HENT: Negative for congestion.   Eyes: Negative for discharge and redness.  Respiratory: Negative for cough, sputum production, shortness of breath and wheezing.   Cardiovascular: Positive for chest pain and palpitations. Negative for orthopnea, claudication, leg swelling and PND.  Musculoskeletal: Negative for falls and myalgias.  Skin: Positive for rash.  Neurological: Negative for dizziness, tingling, tremors, sensory change, speech change, focal weakness, loss of consciousness and weakness.  Psychiatric/Behavioral: Negative for substance abuse. The patient is not nervous/anxious.   All other systems reviewed and are negative.    EKGs/Labs/Other Studies Reviewed:    Studies reviewed were summarized above. The additional studies were reviewed today:  Zio 11/2019: Patient had a min HR of 51 bpm, max HR of 176 bpm, and avg HR of 82 bpm. Predominant underlying rhythm was Sinus Rhythm. Isolated SVEs were rare (<1.0%), SVE Couplets were rare (<1.0%), and SVE Triplets were rare (<1.0%). Isolated VEs were rare (<1.0%), no significant arrhythmias noted. Patient triggered events were associated with sinus rhythm.   EKG:  EKG is ordered today.  The EKG ordered today demonstrates NSR, 71 bpm, normal axis, early repolarization, no acute ST-T changes, unchanged from prior study  Recent Labs: 11/29/2019: BUN 16; Creatinine,  Ser 0.94; Hemoglobin 14.8; Platelets 264; Potassium 3.7; Sodium 140; TSH 1.697  Recent Lipid Panel    Component Value Date/Time   CHOL 148 12/10/2017 0836   TRIG 43 12/10/2017 0836   HDL 48 12/10/2017 0836   CHOLHDL 3.1 12/10/2017 0836   LDLCALC 91 12/10/2017 0836    PHYSICAL EXAM:    VS:  BP 120/64 (BP Location: Left Arm, Patient Position: Sitting, Cuff Size: Normal)   Pulse 71   Ht 5\' 10"  (1.778 m)   Wt 183 lb 8 oz (83.2 kg)   SpO2 98%   BMI 26.33 kg/m   BMI: Body mass index is 26.33 kg/m.  Physical Exam  Constitutional: He is oriented to person, place, and time. He appears well-developed and well-nourished.  HENT:  Head: Normocephalic and atraumatic.  Eyes: Right eye exhibits no discharge. Left eye exhibits no discharge.  Neck: No JVD present.  Cardiovascular: Normal rate, regular rhythm, S1 normal, S2 normal and normal heart sounds. Exam reveals no distant heart sounds, no friction rub, no midsystolic click and  no opening snap.  No murmur heard. Pulses:      Posterior tibial pulses are 2+ on the right side and 2+ on the left side.  Pulmonary/Chest: Effort normal and breath sounds normal. No respiratory distress. He has no decreased breath sounds. He has no wheezes. He has no rales. He exhibits no tenderness.  Abdominal: Soft. He exhibits no distension. There is no abdominal tenderness.  Musculoskeletal:        General: No edema.     Cervical back: Normal range of motion.  Neurological: He is alert and oriented to person, place, and time.  Skin: Skin is warm and dry. No cyanosis. Nails show no clubbing.  Psychiatric: He has a normal mood and affect. His speech is normal and behavior is normal. Judgment and thought content normal.    Wt Readings from Last 3 Encounters:  12/31/19 183 lb 8 oz (83.2 kg)  12/02/19 182 lb 12 oz (82.9 kg)  11/29/19 180 lb (81.6 kg)     ASSESSMENT & PLAN:   1. Palpitations/chest pain: Symptoms have overall improved with decreased anxiety  level and caffeine intake.  Zio patch reassuring as outlined above.  Discussed possible addition of Kardia mobile app for him to follow his symptoms.  Given his potential exaggerated chronotropic response with exercise and chest pain we will plan to have him come in for a GXT to evaluate for EKG changes consistent with ischemia or rhythm changes.  Overall, his chest pain presentation is atypical.  There is no family history of premature coronary disease.  Schedule echo to evaluate for any structural abnormalities.  Recent TSH normal.  Continue to limit caffeine and alcohol intake.   Disposition: F/u with Dr. Azucena Cecil or an APP in 1 month.   Medication Adjustments/Labs and Tests Ordered: Current medicines are reviewed at length with the patient today.  Concerns regarding medicines are outlined above. Medication changes, Labs and Tests ordered today are summarized above and listed in the Patient Instructions accessible in Encounters.   Signed, Eula Listen, PA-C 12/31/2019 9:21 AM     [facility] [address] [place], [place] [address] [phone]

## 2019-12-31 ENCOUNTER — Encounter: Payer: Self-pay | Admitting: Physician Assistant

## 2019-12-31 ENCOUNTER — Other Ambulatory Visit: Payer: Self-pay

## 2019-12-31 ENCOUNTER — Ambulatory Visit (INDEPENDENT_AMBULATORY_CARE_PROVIDER_SITE_OTHER): Payer: BC Managed Care – PPO | Admitting: Physician Assistant

## 2019-12-31 VITALS — BP 120/64 | HR 71 | Ht 70.0 in | Wt 183.5 lb

## 2019-12-31 DIAGNOSIS — R002 Palpitations: Secondary | ICD-10-CM

## 2019-12-31 DIAGNOSIS — R0789 Other chest pain: Secondary | ICD-10-CM | POA: Diagnosis not present

## 2019-12-31 NOTE — Patient Instructions (Addendum)
Medication Instructions:  Your physician recommends that you continue on your current medications as directed. Please refer to the Current Medication list given to you today.  *If you need a refill on your cardiac medications before your next appointment, please call your pharmacy*   Lab Work: None ordered  If you have labs (blood work) drawn today and your tests are completely normal, you will receive your results only by: Marland Kitchen MyChart Message (if you have MyChart) OR . A paper copy in the mail If you have any lab test that is abnormal or we need to change your treatment, we will call you to review the results.   Testing/Procedures: 1- Echo  Please return to [facility] on ______________ at _______________ AM/PM for an Echocardiogram. Your physician has requested that you have an echocardiogram. Echocardiography is a painless test that uses sound waves to create images of your heart. It provides your doctor with information about the size and shape of your heart and how well your heart's chambers and valves are working. This procedure takes approximately one hour. There are no restrictions for this procedure. Please note; depending on visual quality an IV may need to be placed.   2-  Exercise Stress Test   An exercise stress test is a test to check how your heart works during exercise and checks for coronary artery disease.  Your heart rate will be watched while you are resting and while you are exercising.   Patches (electrodes) will be put on your chest. Do not put lotions, powders, creams, or oils on your chest before the test. Wires will be connected to the patches. The wires will send signals to a machine to record your heartbeat. Wear comfortable shoes and clothing.  Follow instructions from your doctor about what you cannot eat or drink before the test.  Before the procedure  Follow instructions from your doctor about what you cannot eat or drink. ? Do not have  any drinks or foods that have caffeine in them for 24 hours before the test, or as told by your doctor. This includes coffee, tea (even decaf tea), sodas, chocolate, and cocoa. ? Hold beta-blocker medicines for night before and the morning of. Take all other BP meds that aren't Bbs.   If you use an inhaler, bring it with you to the test.  Do not use any products that have nicotine or tobacco in them, such as cigarettes and e-cigarettes. Stop using them at least 4 hours before the test. If you need help quitting, ask your doctor.    Follow-Up: At [facility], you and your health needs are our priority.  As part of our continuing mission to provide you with exceptional heart care, we have created designated Provider Care Teams.  These Care Teams include your primary Cardiologist (physician) and Advanced Practice Providers (APPs -  Physician Assistants and Nurse Practitioners) who all work together to provide you with the care you need, when you need it.  We recommend signing up for the patient portal called "MyChart".  Sign up information is provided on this After Visit Summary.  MyChart is used to connect with patients for Virtual Visits (Telemedicine).  Patients are able to view lab/test results, encounter notes, upcoming appointments, etc.  Non-urgent messages can be sent to your provider as well.   To learn more about what you can do with MyChart, go to [email or web address].    Your next appointment:  1 month

## 2020-01-06 ENCOUNTER — Other Ambulatory Visit: Payer: Self-pay

## 2020-01-06 ENCOUNTER — Other Ambulatory Visit
Admission: RE | Admit: 2020-01-06 | Discharge: 2020-01-06 | Disposition: A | Discharge status: Home / Self Care | Payer: BC Managed Care – PPO | Source: Ambulatory Visit | Attending: Physician Assistant | Admitting: Physician Assistant

## 2020-01-06 DIAGNOSIS — Z01812 Encounter for preprocedural laboratory examination: Secondary | ICD-10-CM | POA: Insufficient documentation

## 2020-01-06 DIAGNOSIS — Z20822 Contact with and (suspected) exposure to covid-19: Secondary | ICD-10-CM | POA: Diagnosis not present

## 2020-01-06 LAB — SARS CORONAVIRUS 2 (TAT 6-24 HRS): SARS Coronavirus 2: NEGATIVE

## 2020-01-07 ENCOUNTER — Ambulatory Visit (INDEPENDENT_AMBULATORY_CARE_PROVIDER_SITE_OTHER): Payer: BC Managed Care – PPO

## 2020-01-07 DIAGNOSIS — R0789 Other chest pain: Secondary | ICD-10-CM

## 2020-01-07 LAB — EXERCISE TOLERANCE TEST
Estimated workload: 12.1 METS
Exercise duration (min): 10 min
Exercise duration (sec): 15 s
MPHR: 191 {beats}/min
Peak HR: 196 {beats}/min
Percent HR: 102 %
RPE: 13
Rest HR: 90 {beats}/min

## 2020-01-10 ENCOUNTER — Ambulatory Visit (HOSPITAL_COMMUNITY): Payer: BC Managed Care – PPO | Attending: Cardiovascular Disease

## 2020-01-10 ENCOUNTER — Telehealth: Payer: Self-pay

## 2020-01-10 ENCOUNTER — Other Ambulatory Visit: Payer: Self-pay

## 2020-01-10 DIAGNOSIS — R0789 Other chest pain: Secondary | ICD-10-CM | POA: Insufficient documentation

## 2020-01-10 NOTE — Telephone Encounter (Signed)
-----   Message from Sondra Barges, PA-C sent at 01/10/2020 12:21 PM EDT ----- Echo showed normal pump function, normal wall motion, normal relaxation of the heart, and normal left ventricular cavity size.  A bicuspid aortic valve with moderate leakiness (review with primary cardiologist appears to be mild AI) without evidence of narrowing was noted.  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    Recommendations: -He needs a follow-up echo in 12 months to reevaluate his aortic root and ascending aorta as well as for progression/stability of his bicuspid and leaky aortic valve with follow-up appointment thereafter -No restrictions -He should follow-up with his primary cardiologist as directed -Reviewed with patient's primary cardiologist

## 2020-01-10 NOTE — Telephone Encounter (Signed)
-----   Message from Sondra Barges, PA-C sent at 01/08/2020  7:26 AM EDT ----- Stress test showed optimal exercise tolerance with no evidence of ischemia and normal BP response to exercise.  Reassuring study.

## 2020-01-10 NOTE — Telephone Encounter (Signed)
Call to patient to review stress test results.    Pt verbalized understanding and has no further questions at this time.    Advised pt to call for any further questions or concerns.  No further orders.   

## 2020-01-10 NOTE — Telephone Encounter (Signed)
Call to patient to review echo results.  °  °Pt verbalized understanding and has no further questions at this time.  °  °Advised pt to call for any further questions or concerns.  °No further orders.  °Confirmed upcoming appt later this month.  °

## 2020-01-31 ENCOUNTER — Ambulatory Visit: Payer: BC Managed Care – PPO | Admitting: Cardiology

## 2020-02-01 ENCOUNTER — Encounter: Payer: Self-pay | Admitting: Cardiology

## 2020-04-21 ENCOUNTER — Telehealth: Payer: Self-pay | Admitting: Cardiology

## 2020-04-21 NOTE — Telephone Encounter (Signed)
Patient calling to discuss   Echo results   Need for missed fu visit ( if needed )   [place]erral [place]

## 2020-04-26 NOTE — Telephone Encounter (Signed)
LMOM for patient to call back.

## 2020-05-03 NOTE — Telephone Encounter (Signed)
Patient returning call.

## 2020-05-22 NOTE — Telephone Encounter (Signed)
Spoke with patient and discussed echo results from April 2021 as requested. Patient was very grateful for the call.

## 2021-01-11 ENCOUNTER — Telehealth: Payer: Self-pay | Admitting: Cardiology

## 2021-01-11 NOTE — Telephone Encounter (Signed)
Patient is calling requesting his notes from [facility], due to his move to [place]. Dr. Dellis Filbert fax [phone]

## 2021-05-14 ENCOUNTER — Telehealth: Payer: Self-pay

## 2021-05-14 NOTE — Telephone Encounter (Signed)
[facility] calling to obtain ECHO via cd   Please send to [facility] 5th floor    [address]    [place] [address]

## 2021-05-27 IMAGING — CR DG CHEST 2V
2 series · 2 of 2 positions shown · non-contrast
Comparison: No pertinent prior studies available for comparison.

CLINICAL DATA: Palpitations.

EXAM:
CHEST - 2 VIEW

[chest pa]
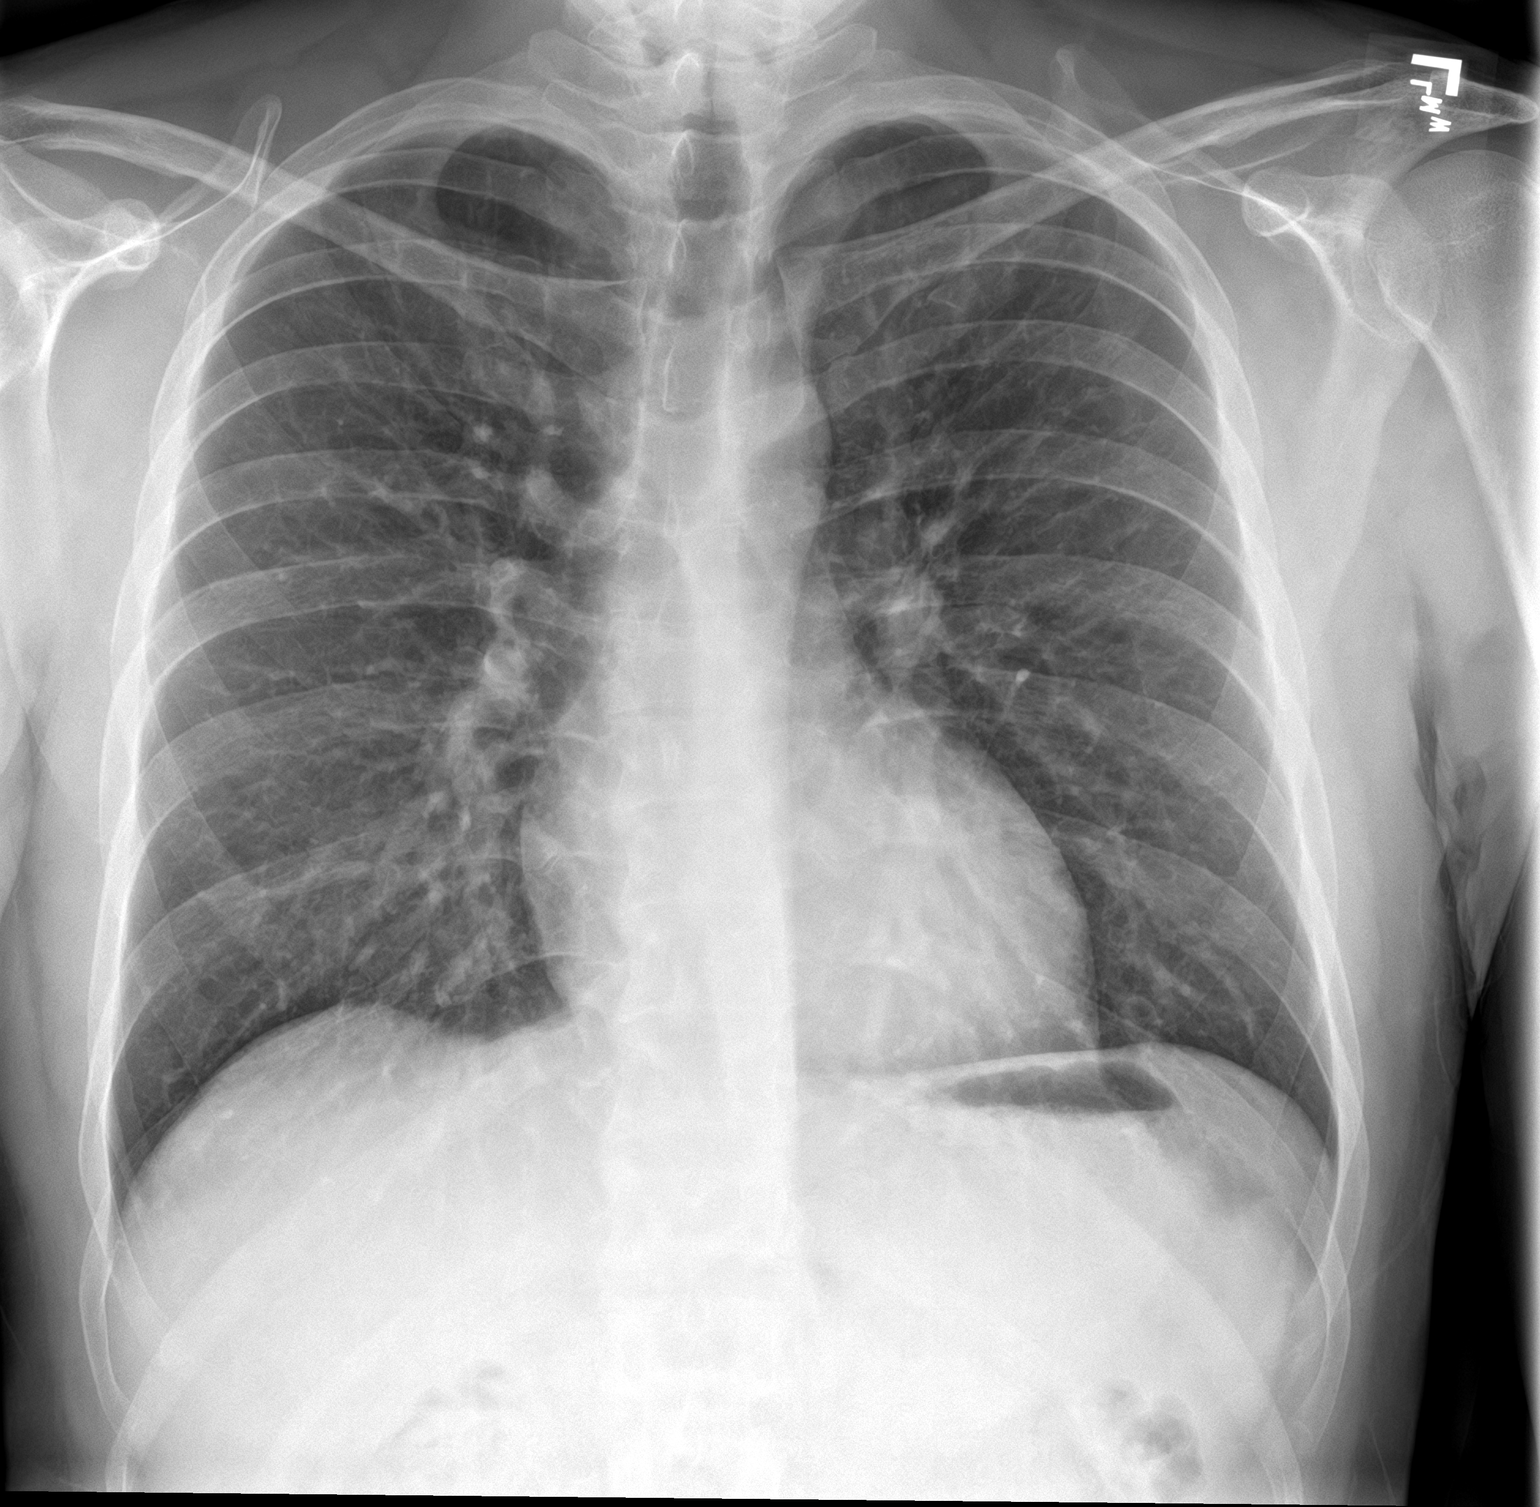

[chest lat]
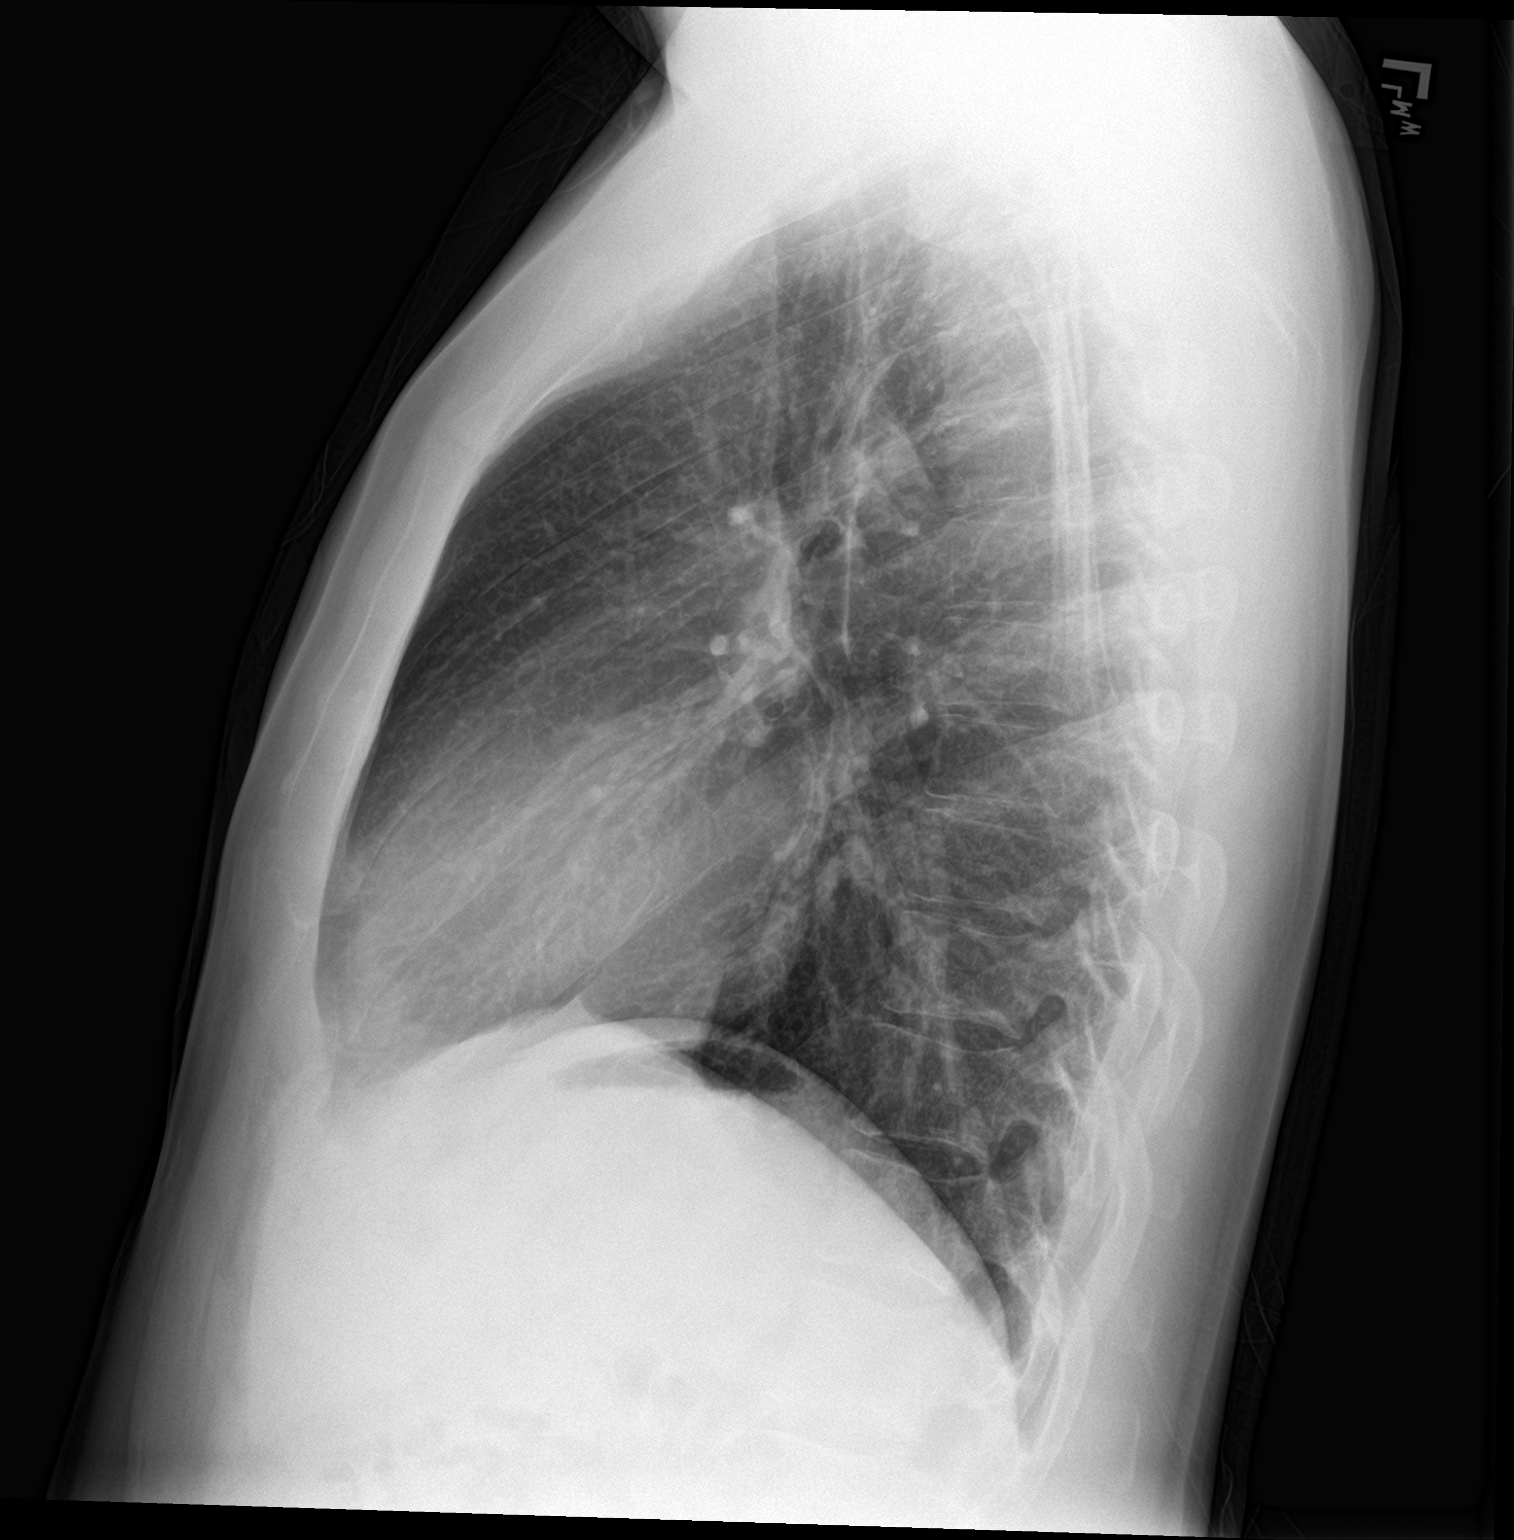

[2 of 2 positions shown; findings below may reference images not displayed]

FINDINGS: Heart size within normal limits. No evidence of airspace
consolidation within the lungs. No evidence of pleural effusion or
pneumothorax. No acute bony abnormality.
IMPRESSION: No evidence of acute cardiopulmonary abnormality.
